# Patient Record
Sex: Male | Born: 1955 | Race: White | Hispanic: No | Marital: Married | Smoking: Never smoker
Health system: Southern US, Community
[De-identification: ages and names within clinical notes are randomized; demographics above are authoritative.]

## PROBLEM LIST (undated history)

## (undated) HISTORY — PX: HERNIA REPAIR: SHX51

## (undated) HISTORY — PX: APPENDECTOMY: SHX54

---

## 2005-07-15 ENCOUNTER — Ambulatory Visit: Payer: Self-pay | Admitting: Family Medicine

## 2005-09-30 ENCOUNTER — Ambulatory Visit: Payer: Self-pay | Admitting: Family Medicine

## 2018-06-07 ENCOUNTER — Other Ambulatory Visit: Payer: Self-pay

## 2018-06-07 ENCOUNTER — Emergency Department (HOSPITAL_COMMUNITY)
Admission: EM | Admit: 2018-06-07 | Discharge: 2018-06-08 | Disposition: A | Discharge status: Home / Self Care | Payer: BLUE CROSS/BLUE SHIELD | Attending: Emergency Medicine | Admitting: Emergency Medicine

## 2018-06-07 ENCOUNTER — Encounter (HOSPITAL_COMMUNITY): Payer: Self-pay | Admitting: Emergency Medicine

## 2018-06-07 ENCOUNTER — Emergency Department (HOSPITAL_COMMUNITY): Payer: BLUE CROSS/BLUE SHIELD

## 2018-06-07 DIAGNOSIS — R202 Paresthesia of skin: Secondary | ICD-10-CM | POA: Insufficient documentation

## 2018-06-07 DIAGNOSIS — Z79899 Other long term (current) drug therapy: Secondary | ICD-10-CM | POA: Diagnosis not present

## 2018-06-07 DIAGNOSIS — Y999 Unspecified external cause status: Secondary | ICD-10-CM | POA: Diagnosis not present

## 2018-06-07 DIAGNOSIS — Y929 Unspecified place or not applicable: Secondary | ICD-10-CM | POA: Diagnosis not present

## 2018-06-07 DIAGNOSIS — Y9352 Activity, horseback riding: Secondary | ICD-10-CM | POA: Diagnosis not present

## 2018-06-07 DIAGNOSIS — R200 Anesthesia of skin: Secondary | ICD-10-CM | POA: Diagnosis not present

## 2018-06-07 DIAGNOSIS — S4991XA Unspecified injury of right shoulder and upper arm, initial encounter: Secondary | ICD-10-CM | POA: Diagnosis present

## 2018-06-07 DIAGNOSIS — S134XXA Sprain of ligaments of cervical spine, initial encounter: Secondary | ICD-10-CM | POA: Diagnosis not present

## 2018-06-07 LAB — COMPREHENSIVE METABOLIC PANEL
ALK PHOS: 58 U/L (ref 38–126)
ALT: 37 U/L (ref 0–44)
AST: 41 U/L (ref 15–41)
Albumin: 3.9 g/dL (ref 3.5–5.0)
Anion gap: 8 (ref 5–15)
BILIRUBIN TOTAL: 0.9 mg/dL (ref 0.3–1.2)
BUN: 19 mg/dL (ref 6–20)
CALCIUM: 9.4 mg/dL (ref 8.9–10.3)
CHLORIDE: 107 mmol/L (ref 98–111)
CO2: 24 mmol/L (ref 22–32)
CREATININE: 1.13 mg/dL (ref 0.61–1.24)
Glucose, Bld: 103 mg/dL — ABNORMAL HIGH (ref 70–99)
Potassium: 3.7 mmol/L (ref 3.5–5.1)
Sodium: 139 mmol/L (ref 135–145)
Total Protein: 6.3 g/dL — ABNORMAL LOW (ref 6.5–8.1)

## 2018-06-07 LAB — I-STAT CHEM 8, ED
BUN: 32 mg/dL — ABNORMAL HIGH (ref 6–20)
CREATININE: 1.2 mg/dL (ref 0.61–1.24)
Calcium, Ion: 1.05 mmol/L — ABNORMAL LOW (ref 1.15–1.40)
Chloride: 107 mmol/L (ref 98–111)
GLUCOSE: 102 mg/dL — AB (ref 70–99)
HCT: 43 % (ref 39.0–52.0)
HEMOGLOBIN: 14.6 g/dL (ref 13.0–17.0)
POTASSIUM: 5.6 mmol/L — AB (ref 3.5–5.1)
Sodium: 138 mmol/L (ref 135–145)
TCO2: 27 mmol/L (ref 22–32)

## 2018-06-07 LAB — CBC
HEMATOCRIT: 43.5 % (ref 39.0–52.0)
HEMOGLOBIN: 15 g/dL (ref 13.0–17.0)
MCH: 29.8 pg (ref 26.0–34.0)
MCHC: 34.5 g/dL (ref 30.0–36.0)
MCV: 86.5 fL (ref 80.0–100.0)
NRBC: 0 % (ref 0.0–0.2)
Platelets: 221 10*3/uL (ref 150–400)
RBC: 5.03 MIL/uL (ref 4.22–5.81)
RDW: 12 % (ref 11.5–15.5)
WBC: 10.9 10*3/uL — AB (ref 4.0–10.5)

## 2018-06-07 LAB — I-STAT CG4 LACTIC ACID, ED: Lactic Acid, Venous: 0.97 mmol/L (ref 0.5–1.9)

## 2018-06-07 LAB — SAMPLE TO BLOOD BANK

## 2018-06-07 LAB — PROTIME-INR
INR: 1.09
Prothrombin Time: 14 seconds (ref 11.4–15.2)

## 2018-06-07 LAB — CDS SEROLOGY

## 2018-06-07 LAB — ETHANOL: Alcohol, Ethyl (B): <10 mg/dL (ref <10)

## 2018-06-07 MED ORDER — DEXAMETHASONE SODIUM PHOSPHATE 10 MG/ML IJ SOLN
10.0000 mg | Freq: Once | INTRAMUSCULAR | Status: AC
  Administered 2018-06-08: 10 mg via INTRAVENOUS
  Filled 2018-06-07: qty 1

## 2018-06-07 MED ORDER — FENTANYL CITRATE (PF) 100 MCG/2ML IJ SOLN
50.0000 ug | INTRAMUSCULAR | Status: DC | PRN
  Administered 2018-06-07 (×2): 50 ug via INTRAVENOUS
  Filled 2018-06-07 (×2): qty 2

## 2018-06-07 MED ORDER — KETOROLAC TROMETHAMINE 30 MG/ML IJ SOLN
30.0000 mg | Freq: Once | INTRAMUSCULAR | Status: AC
  Administered 2018-06-08: 30 mg via INTRAVENOUS
  Filled 2018-06-07: qty 1

## 2018-06-07 MED ORDER — HYDROMORPHONE HCL 1 MG/ML IJ SOLN
1.0000 mg | Freq: Once | INTRAMUSCULAR | Status: AC
  Administered 2018-06-07: 1 mg via INTRAVENOUS
  Filled 2018-06-07: qty 1

## 2018-06-07 NOTE — ED Provider Notes (Signed)
[facility] EMERGENCY DEPARTMENT Provider Note   CSN: [id number] Arrival date & time: 06/07/18  1949     History   Chief Complaint Chief Complaint  Patient presents with  . Trauma  . Shoulder Pain    HPI Bryan Hebert is a 62 y.o. male.  HPI  Patient is a 62 year old male with no PMHx who presents s/p fall from horse this evening.  Patient states he was "bucked off and landed on his right arm."  No LOC however he reports seeing "stars."  No helmet.  He is not amnestic to the event.  Patient presently complains of RUE burning sensation and pain that is now progressing to his LUE.  No alleviating or aggravating factors.  Pain worsens when lifting his arms up.  Symptoms are constant and worsening.  He denies HA, vision changes, CP, SOB, abdominal pain, and N/V/D.  On arrival patient AO x3 with GCS 15.  HDS.  C-collar in place.  History reviewed. No pertinent past medical history.  There are no active problems to display for this patient.   History reviewed. No pertinent surgical history.      Home Medications    Prior to Admission medications   Medication Sig Start Date End Date Taking? Authorizing Provider  Multiple Vitamins-Minerals (MULTIVITAMIN WITH MINERALS) tablet Take 1 tablet by mouth daily.   Yes [provider]  Omega-3 Fatty Acids (FISH OIL) 1000 MG CAPS Take 1,000 mg by mouth daily.   Yes [provider]    Family History No family history on file.  Social History Social History   Tobacco Use  . Smoking status: Never Smoker  . Smokeless tobacco: Never Used  Substance Use Topics  . Alcohol use: Not Currently  . Drug use: Never     Allergies   Patient has no known allergies.   Review of Systems Review of Systems  Constitutional: Negative for chills and fever.  HENT: Negative for sore throat.   Eyes: Negative for pain and visual disturbance.  Respiratory: Negative for cough and shortness of breath.     Cardiovascular: Negative for chest pain and palpitations.  Gastrointestinal: Negative for abdominal pain, diarrhea, nausea and vomiting.  Genitourinary: Negative for dysuria and hematuria.  Musculoskeletal: Negative for back pain.       + Upper extremity pain  Skin: Negative for color change and rash.  Neurological: Negative for seizures and syncope.       + Burning pain in upper extremities  All other systems reviewed and are negative.    Physical Exam Updated Vital Signs BP (!) 146/87 (BP Location: Left Arm)   Pulse 85   Temp 98.2 F (36.8 C) (Oral)   Resp 18   SpO2 95%   Physical Exam  Constitutional: He is oriented to person, place, and time. He appears well-developed and well-nourished. No distress.  HENT:  Head: Normocephalic and atraumatic.  Mouth/Throat: Oropharynx is clear and moist.  Dirt on R face.  No hemotympanum bilaterally.  No septal hematoma.  Oropharynx atraumatic.  Eyes: Pupils are equal, round, and reactive to light. Conjunctivae and EOM are normal.  Neck: Neck supple.  C-collar in place.  Cardiovascular: Normal rate, regular rhythm and intact distal pulses.  Pulmonary/Chest: Effort normal and breath sounds normal. No respiratory distress. He has no wheezes.  Abdominal: Soft. He exhibits no distension. There is no tenderness. There is no guarding.  Musculoskeletal:  BUE with subjective burning paresthesias.  Equal grip strength bilaterally.  Difficulty  flexing shoulders bilaterally 2/2 pain.  Arm flexion/extension 4/5.  Neurovascularly intact with 2+ equal radial pulses. BLE atraumatic.  2+ DP pulses.  Full ROM and strength.  Intact sensation.  Compartments soft.    Neurological: He is alert and oriented to person, place, and time. No cranial nerve deficit.  No midline spinal TTP, stepoffs, or deformities.  Skin: Skin is warm and dry. Capillary refill takes less than 2 seconds. No rash noted.  No abrasions or wounds.  Psychiatric: He has a normal mood and  affect.  Nursing note and vitals reviewed.    ED Treatments / Results  Labs (all labs ordered are listed, but only abnormal results are displayed) Labs Reviewed  COMPREHENSIVE METABOLIC PANEL - Abnormal; Notable for the following components:      Result Value   Glucose, Bld 103 (*)    Total Protein 6.3 (*)    All other components within normal limits  CBC - Abnormal; Notable for the following components:   WBC 10.9 (*)    All other components within normal limits  I-STAT CHEM 8, ED - Abnormal; Notable for the following components:   Potassium 5.6 (*)    BUN 32 (*)    Glucose, Bld 102 (*)    Calcium, Ion 1.05 (*)    All other components within normal limits  CDS SEROLOGY  ETHANOL  PROTIME-INR  URINALYSIS, ROUTINE W REFLEX MICROSCOPIC  I-STAT CG4 LACTIC ACID, ED  SAMPLE TO BLOOD BANK    EKG None  Radiology Ct Head Wo Contrast  Result Date: 06/07/2018 CLINICAL DATA:  Patient was thrown from horse landing on right arm. EXAM: CT HEAD WITHOUT CONTRAST CT CERVICAL SPINE WITHOUT CONTRAST TECHNIQUE: Multidetector CT imaging of the head and cervical spine was performed following the standard protocol without intravenous contrast. Multiplanar CT image reconstructions of the cervical spine were also generated. COMPARISON:  None. FINDINGS: CT HEAD FINDINGS BRAIN: The ventricles and sulci are normal. No intraparenchymal hemorrhage, mass effect nor midline shift. No acute large vascular territory infarcts. Grey-white matter distinction is maintained. The basal ganglia are unremarkable. No abnormal extra-axial fluid collections. Basal cisterns are not effaced and midline. The brainstem and cerebellar hemispheres are without acute abnormalities. VASCULAR: Unremarkable. SKULL/SOFT TISSUES: No skull fracture. No significant soft tissue swelling. ORBITS/SINUSES: The included ocular globes and orbital contents are normal.The mastoid air cells are clear. Mild frontal and right ethmoid sinus mucosal  thickening. Minimal peripheral mucosal thickening of the included maxillary sinuses. No air-fluid levels. OTHER: None. CT CERVICAL SPINE FINDINGS Alignment: Maintained cervical lordosis Skull base and vertebrae: No acute fracture. No primary bone lesion or focal pathologic process. Soft tissues and spinal canal: No prevertebral fluid or swelling. No visible canal hematoma. Disc levels: C3-4 and C4-5 central disc bulges. No significant central or foraminal encroachment. Uncovertebral joint osteoarthritis with spurring noted at from C3-4 through C6-7 bilaterally contributing to foraminal encroachment, mild on the left at C3-4, moderate at C4-5 and mild at C6-7. Mild right-sided C3-4, C4-5 and mild-to-moderate C5-6 foraminal encroachment. Upper chest: Negative. Other: None IMPRESSION: CT head: No acute intracranial abnormality. CT cervical spine: No acute cervical spine fracture or posttraumatic listhesis. Uncovertebral joint osteoarthritis and disc bulges as above described. Electronically Signed   By: Tollie Eth M.D.   On: 06/07/2018 21:07   Ct Cervical Spine Wo Contrast  Result Date: 06/07/2018 CLINICAL DATA:  Patient was thrown from horse landing on right arm. EXAM: CT HEAD WITHOUT CONTRAST CT CERVICAL SPINE WITHOUT CONTRAST TECHNIQUE: Multidetector CT  imaging of the head and cervical spine was performed following the standard protocol without intravenous contrast. Multiplanar CT image reconstructions of the cervical spine were also generated. COMPARISON:  None. FINDINGS: CT HEAD FINDINGS BRAIN: The ventricles and sulci are normal. No intraparenchymal hemorrhage, mass effect nor midline shift. No acute large vascular territory infarcts. Grey-white matter distinction is maintained. The basal ganglia are unremarkable. No abnormal extra-axial fluid collections. Basal cisterns are not effaced and midline. The brainstem and cerebellar hemispheres are without acute abnormalities. VASCULAR: Unremarkable. SKULL/SOFT  TISSUES: No skull fracture. No significant soft tissue swelling. ORBITS/SINUSES: The included ocular globes and orbital contents are normal.The mastoid air cells are clear. Mild frontal and right ethmoid sinus mucosal thickening. Minimal peripheral mucosal thickening of the included maxillary sinuses. No air-fluid levels. OTHER: None. CT CERVICAL SPINE FINDINGS Alignment: Maintained cervical lordosis Skull base and vertebrae: No acute fracture. No primary bone lesion or focal pathologic process. Soft tissues and spinal canal: No prevertebral fluid or swelling. No visible canal hematoma. Disc levels: C3-4 and C4-5 central disc bulges. No significant central or foraminal encroachment. Uncovertebral joint osteoarthritis with spurring noted at from C3-4 through C6-7 bilaterally contributing to foraminal encroachment, mild on the left at C3-4, moderate at C4-5 and mild at C6-7. Mild right-sided C3-4, C4-5 and mild-to-moderate C5-6 foraminal encroachment. Upper chest: Negative. Other: None IMPRESSION: CT head: No acute intracranial abnormality. CT cervical spine: No acute cervical spine fracture or posttraumatic listhesis. Uncovertebral joint osteoarthritis and disc bulges as above described. Electronically Signed   By: Tollie Eth M.D.   On: 06/07/2018 21:07   Mr Cervical Spine W Or Wo Contrast  Result Date: 06/08/2018 CLINICAL DATA:  Thrown from a horse landing on the right arm. Right arm numbness in shoulder pain EXAM: MRI CERVICAL SPINE WITHOUT AND WITH CONTRAST TECHNIQUE: Multiplanar and multiecho pulse sequences of the cervical spine, to include the craniocervical junction and cervicothoracic junction, were obtained without and with intravenous contrast. CONTRAST:  7 cc Gadavist COMPARISON:  06/07/2018 CT scan FINDINGS: Alignment: No vertebral subluxation is observed. Vertebrae: Type 1 degenerative endplate findings eccentric to the left at C5-6 posteriorly. Associated subtle endplate enhancement. No  appreciable fracture. Cord: Abnormal central T2 signal without overt enhancement centrally along the gray matter of the cord at the C3-4 level, relatively bilaterally symmetric. The cord appears otherwise unremarkable. Posterior Fossa, vertebral arteries, paraspinal tissues: Prevertebral edema/fluid signal extending from the C1-2 level down to the C5 level. Supraspinous ligament edema tracking between C2, C3, and C4, with potential interspinous edema also at C2-3, C3-4, and questionably C4-5. Disc levels: C2-3: Unremarkable. C3-4: Prominent bilateral foraminal impingement and moderate central narrowing of the thecal sac due to central disc protrusion, uncinate spurring, and facet arthropathy. This is the level with central cord edema signal. Small right facet effusion. Possible left ligamentum flavum injury posteriorly at this level. C4-5: Prominent right and moderate to prominent left foraminal stenosis with borderline central narrowing of the thecal sac due to disc bulge, uncinate spurring, and facet arthropathy. C5-6: Prominent bilateral foraminal stenosis and moderate central narrowing of the thecal sac due to central disc protrusion, uncinate spurring, and facet arthropathy. C6-7: Prominent left and moderate to prominent right foraminal stenosis due to uncinate spurring, disc bulge, and facet arthropathy along with potential left foraminal disc protrusion. C7-T1: No impingement.  Mild bilateral facet arthropathy. IMPRESSION: 1. Suspected ligamentous injuries in the upper cervical spine most especially at C3-4 where there is prevertebral edema, a small right facet effusion, suspected ligamentum flavum  irregularity/tear, interspinous edema suggesting interspinous tear, and edema along the supraspinous ligament which may well be injured. This is a level of particular significance in this case because there is also some central cord edema/contusion. No overt subluxation. 2. There is also edema along the  supraspinous ligament at the C2-3 level, and possible interspinous edema at C2-3. The prevertebral edema extends all the way from C1 down to C5, and may be an indicator of an indistinct or occult anterior longitudinal ligament tear. 3. Cervical spondylosis and degenerative disc disease causing prominent levels of impingement at C3-4, C4-5, C5-6, and C6-7 primarily from spurring and degenerative disc disease. Electronically Signed   By: Gaylyn Rong M.D.   On: 06/08/2018 02:01   Dg Chest Port 1 View  Result Date: 06/07/2018 CLINICAL DATA:  Fall. EXAM: PORTABLE CHEST 1 VIEW COMPARISON:  06/07/2018 FINDINGS: Heart and mediastinal contours are within normal limits. No focal opacities or effusions. No acute bony abnormality. IMPRESSION: No active disease. Electronically Signed   By: Charlett Nose M.D.   On: 06/07/2018 20:34   Dg Shoulder Right Portable  Result Date: 06/07/2018 CLINICAL DATA:  Fall from horse, landing on right arm.  Pain. EXAM: PORTABLE RIGHT SHOULDER COMPARISON:  None. FINDINGS: Degenerative changes in the [facility] joint with joint space narrowing and spurring. Glenohumeral joint is maintained. No acute bony abnormality. Specifically, no fracture, subluxation, or dislocation. Soft tissues are intact. IMPRESSION: Mild degenerative changes in the right AC joint. No acute bony abnormality. Electronically Signed   By: Charlett Nose M.D.   On: 06/07/2018 20:38    Procedures Procedures (including critical care time)  Medications Ordered in ED Medications  fentaNYL (SUBLIMAZE) injection 50 mcg (50 mcg Intravenous Given 06/07/18 2218)  HYDROmorphone (DILAUDID) injection 1 mg (1 mg Intravenous Given 06/07/18 2353)  ketorolac (TORADOL) 30 MG/ML injection 30 mg (30 mg Intravenous Given 06/08/18 0001)  dexamethasone (DECADRON) injection 10 mg (10 mg Intravenous Given 06/08/18 0001)  gadobutrol (GADAVIST) 1 MMOL/ML injection 7.5 mL (7 mLs Intravenous Contrast Given 06/08/18 0110)     Initial  Impression / Assessment and Plan / ED Course  I have reviewed the triage vital signs and the nursing notes.  Pertinent labs & imaging results that were available during my care of the patient were reviewed by me and considered in my medical decision making (see chart for details).    Patient is a 62 year old male with no PMHx who presents s/p fall from horse this evening with burning sensation, pain, and weakness to BUE.  No LOC.  On arrival HDS and well appearing.  C collar in place.  Exam as above concerning for cervical spine injury vs cord trauma (central cord).  NVI throughout.  CTH - no acute intracranial abnormality CT C spine - no acute cervical spine fracture or posttraumatic listhesis.  Uncovertebral joint osteoarthritis and disc bulges. XR R shoulder - no acute bony abnormality, fracture, or dislocation CXR - no acute cardiopulmonary rpocess  Patient with continued BUE weakness, pain, and subjective burning after fentanyl x 3.  Will obtain MRI C spine to further assess.  Dilaudid, Toradol, and prednisone given.  Patient care transferred to [facility] on 06/09/2018 at 1201.  MRI is currently pending.  Please refer to their note for the remainder of ED care and ultimate disposition.   Final Clinical Impressions(s) / ED Diagnoses   Final diagnoses:  Fall from horse, initial encounter    ED Discharge Orders    None  Abelardo Diesel, MD 06/08/18 [id number]    Derwood Kaplan, MD 06/09/18 [phone]

## 2018-06-07 NOTE — ED Notes (Signed)
Pt st's no relief from pain med. 

## 2018-06-07 NOTE — ED Triage Notes (Signed)
Patient was thrown from horse and landed on right arm under him.  No LOC, full recall of incident.  Patient with right arm numbness and shoulder pain.  GCS 15.  VSS.

## 2018-06-08 ENCOUNTER — Emergency Department (HOSPITAL_COMMUNITY): Payer: BLUE CROSS/BLUE SHIELD

## 2018-06-08 MED ORDER — METHYLPREDNISOLONE 4 MG PO TBPK: ORAL_TABLET | ORAL | 0 refills | Status: DC

## 2018-06-08 MED ORDER — DIAZEPAM 2 MG PO TABS: 2.0000 mg | ORAL_TABLET | Freq: Three times a day (TID) | ORAL | 0 refills | Status: DC | PRN

## 2018-06-08 MED ORDER — GADOBUTROL 1 MMOL/ML IV SOLN
7.5000 mL | Freq: Once | INTRAVENOUS | Status: AC | PRN
  Administered 2018-06-08: 7 mL via INTRAVENOUS

## 2018-06-08 MED ORDER — OXYCODONE-ACETAMINOPHEN 5-325 MG PO TABS
2.0000 | ORAL_TABLET | Freq: Once | ORAL | Status: AC
  Administered 2018-06-08: 2 via ORAL
  Filled 2018-06-08: qty 2

## 2018-06-08 MED ORDER — OXYCODONE-ACETAMINOPHEN 5-325 MG PO TABS: 1.0000 | ORAL_TABLET | Freq: Four times a day (QID) | ORAL | 0 refills | Status: DC | PRN

## 2018-06-08 NOTE — ED Notes (Signed)
ED Provider at bedside. 

## 2018-06-08 NOTE — ED Provider Notes (Signed)
3:00 AM Patient care assumed from Dr. Dimple Casey at change of shift.  Pending MRI to further evaluate for potential C-spine injury after patient was thrown from horse.  MRI findings reviewed, concerning for suspected ligamentous injuries in the upper cervical spine most especially at C3-4.  There is some central cord edema/contusion noted at this level.  Edema also extends along the supraspinous ligament at the C2-3 level.  Overall, prevertebral edema extends from C1 down to C5.  These findings have been discussed with NP Doran Durand of the Neurosurgical service. Pending call back with recommendations.  The patient does state that he feels better following Decadron and Toradol.  He has intact sensation bilaterally.  Noted to have 5/5 strength shoulder shrugging against resistance as well as with abduction and abduction of the bilateral upper extremities against resistance.  Grip strength 5/5.  States that his burning discomfort has subsided.  3:18 AM MRI imaging reviewed by Dr. Lovell Sheehan of Neurosurgery. Plan discussed with NP Bergman. NP Doran Durand states that Dr. Lovell Sheehan is comfortable with outpatient follow up in the office on Tuesday given his clinical improvement with Decadron in the ED. Requests placement in Aspen collar. Patient to call the office later this morning to be given appointment time for 06/12/18.  4:01 AM The patient has been placed in an Aspen collar.  And has been discussed with patient and wife who verbalized understanding as well as comfort with discharge.  Had a long, detailed discussion about reasons to return to the emergency department.  Return precautions also provided. Patient discharged in stable condition with no unaddressed concerns.    Antony Madura, PA-C 06/08/18 0402    Zadie Rhine, MD 06/09/18 [phone]

## 2018-06-08 NOTE — ED Notes (Signed)
Patient verbalized understanding of dc instructions, vss, wife at bedside to escort patient home upon discharge. Nad.

## 2018-06-08 NOTE — ED Notes (Signed)
Pt returned from MRI °

## 2018-06-08 NOTE — Discharge Instructions (Addendum)
You were noted to have ligament injury and inflammation on your MRI today.  This requires follow-up with a neurosurgeon (specialist) to ensure proper healing.  Call later on today to schedule an appointment for this coming Tuesday, 06/12/2018.  Wear your cervical collar at all times.  Do not remove it unless instructed to do so by a neurosurgeon.  You should continue prednisone as prescribed.  Do not miss any doses.  You have also been given a prescription for Percocet for management of pain.  You may use this in addition to Valium for treatment of muscle spasms.  Do not drive or drink alcohol after taking Percocet or Valium as these medications may make you drowsy and impair your judgment.  If you develop new or worsening symptoms such as numbness in your arms or legs, weakness/inability to lift your arms or legs, incontinence of stool or urine, difficulty walking, difficulty gripping items with your hands, or other concerning symptoms, promptly return to the emergency department for repeat evaluation.

## 2019-06-07 ENCOUNTER — Other Ambulatory Visit: Payer: Self-pay | Admitting: Urology

## 2019-06-07 DIAGNOSIS — R310 Gross hematuria: Secondary | ICD-10-CM

## 2019-06-14 ENCOUNTER — Other Ambulatory Visit: Payer: Self-pay | Admitting: Urology

## 2019-06-14 DIAGNOSIS — R310 Gross hematuria: Secondary | ICD-10-CM

## 2019-06-20 ENCOUNTER — Ambulatory Visit
Admission: RE | Admit: 2019-06-20 | Discharge: 2019-06-20 | Disposition: A | Discharge status: Home / Self Care | Payer: BC Managed Care – PPO | Source: Ambulatory Visit | Attending: Urology | Admitting: Urology

## 2019-06-20 DIAGNOSIS — R310 Gross hematuria: Secondary | ICD-10-CM

## 2019-06-20 MED ORDER — IOPAMIDOL (ISOVUE-300) INJECTION 61%
125.0000 mL | Freq: Once | INTRAVENOUS | Status: AC | PRN
  Administered 2019-06-20: 125 mL via INTRAVENOUS

## 2019-07-05 ENCOUNTER — Institutional Professional Consult (permissible substitution): Payer: BC Managed Care – PPO | Admitting: Internal Medicine

## 2019-07-10 ENCOUNTER — Other Ambulatory Visit: Payer: Self-pay

## 2019-07-10 ENCOUNTER — Encounter: Payer: Self-pay | Admitting: Internal Medicine

## 2019-07-10 ENCOUNTER — Ambulatory Visit (INDEPENDENT_AMBULATORY_CARE_PROVIDER_SITE_OTHER): Payer: BC Managed Care – PPO | Admitting: Internal Medicine

## 2019-07-10 DIAGNOSIS — R911 Solitary pulmonary nodule: Secondary | ICD-10-CM | POA: Diagnosis not present

## 2019-07-10 NOTE — Patient Instructions (Signed)
The next step is to schedule a PET to see if there is metabolic activity in the nodule that would indicate it might be a tumor and if so what kind

## 2019-07-10 NOTE — Assessment & Plan Note (Signed)
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   Discussed in detail all the  indications, usual  risks and alternatives  relative to the benefits with patient who agrees to proceed with w/u as outlined.      Total time devoted to counseling  > 50 % of initial 23min office visit:  reviewed case with pt/ discussion of options/alternatives/ personally creating written customized instructions  in presence of pt  then going over those specific  Instructions directly with the pt including how to use all of the meds but in particular covering each new medication in detail and the difference between the maintenance= "automatic" meds and the prns using an action plan format for the latter (If this problem/symptom => do that organization reading Left to right).  Please see AVS from this visit for a full list of these instructions which I personally wrote for this pt and  are unique to this visit.

## 2019-07-10 NOTE — Progress Notes (Signed)
   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      History of Present Illness  07/10/2019  Pulmonary/ 1st office eval/[facility]  Chief Complaint  Patient presents with  . Pulmonary Consult    Referred by Dr. Nila Nephew for eval of lung mass.    Dyspnea:  Works as Development worker, international [employer], Chiropractor, shovel all day, works out with wts no sob  Cough: none Sleep: no resp symptoms flat one pillow  SABA use: none  No obvious day to day or daytime variability or assoc excess/ purulent sputum or mucus plugs or hemoptysis or cp or chest tightness, subjective wheeze or overt sinus or hb symptoms.   sleeping without nocturnal  or early am exacerbation  of respiratory  c/o's or need for noct saba. Also denies any obvious fluctuation of symptoms with weather or environmental changes or other aggravating or alleviating factors except as outlined above   No unusual exposure hx or h/o childhood pna/ asthma or knowledge of premature birth.  Current Allergies, Complete Past Medical History, Past Surgical History, Family History, and Social History were reviewed in [employer] record.  ROS  The following are not active complaints unless bolded Hoarseness, sore throat, dysphagia, dental problems, itching, sneezing,  nasal congestion or discharge of excess mucus or purulent secretions, ear ache,   fever, chills, sweats, unintended wt loss or wt gain, classically pleuritic or exertional cp,  orthopnea pnd or arm/hand swelling  or leg swelling, presyncope, palpitations, abdominal pain, anorexia, nausea, vomiting, diarrhea  or change in bowel habits or change in bladder habits, change in stools or change in  urine= hematuria, resolved, dysuria, hematuria,  rash, arthralgias, visual complaints, headache, numbness, weakness or ataxia or problems with walking or coordination,  change in mood or  memory.          No past medical history on file.  Outpatient Medications Prior to Visit  Medication Sig Dispense Refill  . Multiple Vitamins-Minerals (MULTIVITAMIN WITH MINERALS) tablet Take 1 tablet by mouth daily.    . Omega-3 Fatty Acids (FISH OIL) 1000 MG CAPS Take 1,000 mg by mouth daily.    . tamsulosin (FLOMAX) 0.4 MG CAPS capsule Take 0.4 mg by mouth at bedtime.    . diazepam (VALIUM) 2 MG tablet Take 1 tablet (2 mg total) by mouth every 8 (eight) hours as needed for muscle spasms. 20 tablet 0  .     0  . oxyCODONE-acetaminophen (PERCOCET/ROXICET) 5-325 MG tablet Take 1-2 tablets by mouth every 6 (six) hours as needed for severe pain. 20 tablet 0         Objective:     BP 122/78 (BP Location: Left Arm, Cuff Size: Normal)   Pulse 60   Temp 97.6 F (36.4 C) (Temporal)   Ht 5' 8.5" (1.74 m)   Wt 156 lb 12.8 oz (71.1 kg)   SpO2 100% Comment: on RA  BMI 23.49 kg/m   SpO2: 100 %(on RA)      Assessment   No problem-specific Assessment & Plan notes found for this encounter.     Christinia Gully, MD 07/10/2019

## 2019-07-18 ENCOUNTER — Ambulatory Visit (HOSPITAL_COMMUNITY): Payer: BC Managed Care – PPO

## 2019-07-24 ENCOUNTER — Other Ambulatory Visit: Payer: Self-pay

## 2019-07-24 ENCOUNTER — Encounter (HOSPITAL_COMMUNITY)
Admission: RE | Admit: 2019-07-24 | Discharge: 2019-07-24 | Disposition: A | Discharge status: Home / Self Care | Payer: BC Managed Care – PPO | Source: Ambulatory Visit | Attending: Internal Medicine | Admitting: Internal Medicine

## 2019-07-24 DIAGNOSIS — R911 Solitary pulmonary nodule: Secondary | ICD-10-CM | POA: Diagnosis present

## 2019-07-24 LAB — GLUCOSE, CAPILLARY: Glucose-Capillary: 88 mg/dL (ref 70–99)

## 2019-07-24 MED ORDER — FLUDEOXYGLUCOSE F - 18 (FDG) INJECTION: 7.8000 | Freq: Once | INTRAVENOUS | Status: DC | PRN

## 2019-07-30 ENCOUNTER — Other Ambulatory Visit: Payer: Self-pay | Admitting: Internal Medicine

## 2019-07-30 DIAGNOSIS — R911 Solitary pulmonary nodule: Secondary | ICD-10-CM

## 2019-07-30 NOTE — Progress Notes (Signed)
Referral made 

## 2019-08-09 ENCOUNTER — Other Ambulatory Visit: Payer: Self-pay | Admitting: Thoracic Surgery (Cardiothoracic Vascular Surgery)

## 2019-08-09 ENCOUNTER — Institutional Professional Consult (permissible substitution) (INDEPENDENT_AMBULATORY_CARE_PROVIDER_SITE_OTHER): Payer: BC Managed Care – PPO | Admitting: Thoracic Surgery (Cardiothoracic Vascular Surgery)

## 2019-08-09 ENCOUNTER — Other Ambulatory Visit: Payer: Self-pay | Admitting: *Deleted

## 2019-08-09 ENCOUNTER — Other Ambulatory Visit: Payer: Self-pay

## 2019-08-09 ENCOUNTER — Encounter: Payer: Self-pay | Admitting: Thoracic Surgery (Cardiothoracic Vascular Surgery)

## 2019-08-09 ENCOUNTER — Other Ambulatory Visit (HOSPITAL_COMMUNITY): Payer: BC Managed Care – PPO

## 2019-08-09 VITALS — BP 151/90 | HR 77 | Temp 97.3°F | Resp 16 | Ht 68.0 in | Wt 158.0 lb

## 2019-08-09 DIAGNOSIS — R918 Other nonspecific abnormal finding of lung field: Secondary | ICD-10-CM

## 2019-08-09 DIAGNOSIS — R911 Solitary pulmonary nodule: Secondary | ICD-10-CM

## 2019-08-09 NOTE — Progress Notes (Signed)
[address]       [place] [id number]             [phone]                    Bryan FoilJoseph N Hebert [facility] Record #[id number] Date of Birth: Dec 17, 1955  Referring: Bryan CowdenWert, Michael B, MD Primary Care: Patient, No Pcp Per Primary Cardiologist: No primary care provider on file.  Chief Complaint:    Chief Complaint  Patient presents with  . Lung Lesion    Consultation    History of Present Illness:    Bryan FoilJoseph N Hebert 63 y.o. male referred by Dr. Sherene SiresWert for surgical evaluation for right lower lobe pulmonary nodule that was PET avid on most recent scan.  This nodule was found incidentally on a CT scan of the abdomen while he was being worked up for hematuria.  Since that evaluation he has had no more hematuria.  He was seen by pulmonology and it was recommended that he undergo evaluation by CT surgery.  He denies any pulmonary symptoms, his weight has been stable, and he denies any neurologic symptoms.  He has had exposure to [employer], and previously worked as a Pharmacist, hospitalstone cutter.  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    Smoking Hx: Lifelong non-smoker   Zubrod Score: At the time of surgery this patient's most appropriate activity status/level should be described as: [x]     0    Normal activity, no symptoms []     1    Restricted in physical strenuous activity but ambulatory, able to do out light work []     2    Ambulatory and capable of self care, unable to do work activities, up and about               >50 % of waking hours                              []     3    Only limited self care, in bed greater than 50% of waking hours []     4    Completely disabled, no self care, confined to bed or chair []     5    Moribund   No past medical history on file.  No past surgical history on file.  No family history on file.   Social History   Tobacco Use  Smoking Status Never Smoker  Smokeless Tobacco Never Used     Social History   Substance and Sexual Activity  Alcohol Use Not Currently     No Known Allergies  Current Outpatient Medications  Medication Sig Dispense Refill  . Multiple Vitamins-Minerals (MULTIVITAMIN WITH MINERALS) tablet Take 1 tablet by mouth daily.    . Omega-3 Fatty Acids (FISH OIL) 1000 MG CAPS Take 1,000 mg by mouth daily.     No current facility-administered medications for this visit.    Review of Systems  Constitutional: Negative for chills, fever, malaise/fatigue and weight loss.  HENT: Negative.   Eyes: Negative.   Respiratory: Negative.   Cardiovascular: Negative.   Gastrointestinal: Negative.   Genitourinary: Negative for hematuria.  Musculoskeletal: Positive for myalgias and neck pain.  Neurological: Negative.      PHYSICAL EXAMINATION: BP (!) 151/90 (BP Location: Right Arm)   Pulse 77   Temp (!) 97.3  F (36.3 C) (Skin)   Resp 16   Ht 5\' 8"  (1.727 m)   Wt 158 lb (71.7 kg)   SpO2 96%   BMI 24.02 kg/m  Physical Exam  Constitutional: He is oriented to person, place, and time. He appears well-developed and well-nourished. No distress.  HENT:  Head: Normocephalic and atraumatic.  Eyes: Conjunctivae are normal. No scleral icterus.  Neck: No tracheal deviation present.  Cardiovascular: Normal rate and regular rhythm.  No murmur heard. Respiratory: Effort normal and breath sounds normal. No respiratory distress.  GI: He exhibits no distension.  Musculoskeletal:        General: Normal range of motion.     Cervical back: Normal range of motion.  Neurological: He is alert and oriented to person, place, and time.  Skin: Skin is warm and dry. He is not diaphoretic.    Diagnostic Studies & Laboratory data:     Recent Radiology Findings:   NM PET Image Initial (PI) Skull Base To Thigh  Result Date: 07/24/2019 CLINICAL DATA:  Initial treatment strategy for pulmonary nodule. EXAM: NUCLEAR MEDICINE PET SKULL BASE TO THIGH TECHNIQUE: 7.8 mCi F-18  FDG was injected intravenously. Full-ring PET imaging was performed from the skull base to thigh after the radiotracer. CT data was obtained and used for attenuation correction and anatomic localization. Fasting blood glucose: 88 mg/dl COMPARISON:  CT abdomen/pelvis dated 06/20/2019 FINDINGS: Mediastinal blood pool activity: SUV max 2.3 Liver activity: SUV max NA NECK: No hypermetabolic cervical lymphadenopathy. Incidental CT findings: none CHEST: 2.2 x 3.2 cm lobulated posterior right lower lobe mass, max SUV 2.7. Given persistence, this raises concern for a low-grade pulmonary malignancy, possibly pulmonary carcinoid or low-grade bronchogenic neoplasm. 7 mm short axis subcarinal node, within normal limits, max SUV 2.7. Otherwise, no suspicious thoracic lymphadenopathy. Incidental CT findings: Mild atherosclerotic calcifications of the aortic arch. ABDOMEN/PELVIS: No hypermetabolic abdominopelvic lymphadenopathy. No abnormal hypermetabolism in the liver, spleen, pancreas, or adrenal glands. Incidental CT findings: Thick-walled bladder, suggesting chronic bladder obstruction. SKELETON: No focal hypermetabolic activity to suggest skeletal metastasis. Incidental CT findings: Mild degenerative changes of the visualized thoracolumbar spine. IMPRESSION: 3.2 cm lobulated posterior right lower lobe mass, raising concern for low-grade pulmonary malignancy, possibly pulmonary carcinoid or low-grade bronchogenic neoplasm. 7 mm short axis subcarinal node, likely within normal limits. No findings suspicious for metastatic disease. Electronically Signed   By: Julian Hy M.D.   On: 07/24/2019 14:44       I have independently reviewed the above radiology studies  and reviewed the findings with the patient.   Recent Lab Findings: Lab Results  Component Value Date   WBC 10.9 (H) 06/07/2018   HGB 14.6 06/07/2018   HCT 43.0 06/07/2018   PLT 221 06/07/2018   GLUCOSE 102 (H) 06/07/2018   ALT 37 06/07/2018   AST 41  06/07/2018   NA 138 06/07/2018   K 5.6 (H) 06/07/2018   CL 107 06/07/2018   CREATININE 1.20 06/07/2018   BUN 32 (H) 06/07/2018   CO2 24 06/07/2018   INR 1.09 06/07/2018     PFTs: Pending   Assessment / Plan:   63 year old male with a 3.2 cm right lower lobe pulmonary mass.  On PET/CT and had an SUV uptake 2.7.  There is also a 7 mm subcarinal lymph node with an SUV of 2.7.  He is a lifelong non-smoker but has had significant particle exposure to his job.  He will require pulmonary function testing, and has agreed to proceed with  a bronchoscopy right thoracoscopy, and wedge resection of the right lower lobe and possible right lower lobectomy.  Risks and benefits have been discussed.  Given the low PET avidity, it is possible that this may be a carcinoid tumor.     I  spent 40 minutes with  the patient face to face and greater then 50% of the time was spent in counseling and coordination of care.    Corliss Skains 08/09/2019 1:49 PM

## 2019-08-09 NOTE — Progress Notes (Unsigned)
pf

## 2019-08-12 ENCOUNTER — Other Ambulatory Visit (HOSPITAL_COMMUNITY)
Admission: RE | Admit: 2019-08-12 | Discharge: 2019-08-12 | Disposition: A | Discharge status: Home / Self Care | Payer: BC Managed Care – PPO | Source: Ambulatory Visit | Attending: Thoracic Surgery (Cardiothoracic Vascular Surgery) | Admitting: Thoracic Surgery (Cardiothoracic Vascular Surgery)

## 2019-08-12 ENCOUNTER — Other Ambulatory Visit: Payer: Self-pay

## 2019-08-12 ENCOUNTER — Encounter (HOSPITAL_COMMUNITY)
Admission: RE | Admit: 2019-08-12 | Discharge: 2019-08-12 | Disposition: A | Discharge status: Home / Self Care | Payer: BC Managed Care – PPO | Source: Ambulatory Visit | Attending: Thoracic Surgery (Cardiothoracic Vascular Surgery) | Admitting: Thoracic Surgery (Cardiothoracic Vascular Surgery)

## 2019-08-12 ENCOUNTER — Encounter (HOSPITAL_COMMUNITY): Payer: Self-pay

## 2019-08-12 ENCOUNTER — Ambulatory Visit (HOSPITAL_COMMUNITY)
Admission: RE | Admit: 2019-08-12 | Discharge: 2019-08-12 | Disposition: A | Discharge status: Home / Self Care | Payer: BC Managed Care – PPO | Source: Ambulatory Visit | Attending: Thoracic Surgery (Cardiothoracic Vascular Surgery) | Admitting: Thoracic Surgery (Cardiothoracic Vascular Surgery)

## 2019-08-12 DIAGNOSIS — Z01818 Encounter for other preprocedural examination: Secondary | ICD-10-CM | POA: Insufficient documentation

## 2019-08-12 DIAGNOSIS — R911 Solitary pulmonary nodule: Secondary | ICD-10-CM

## 2019-08-12 DIAGNOSIS — Z20828 Contact with and (suspected) exposure to other viral communicable diseases: Secondary | ICD-10-CM | POA: Diagnosis not present

## 2019-08-12 LAB — COMPREHENSIVE METABOLIC PANEL
ALT: 33 U/L (ref 0–44)
AST: 27 U/L (ref 15–41)
Albumin: 3.8 g/dL (ref 3.5–5.0)
Alkaline Phosphatase: 59 U/L (ref 38–126)
Anion gap: 9 (ref 5–15)
BUN: 15 mg/dL (ref 8–23)
CO2: 22 mmol/L (ref 22–32)
Calcium: 8.9 mg/dL (ref 8.9–10.3)
Chloride: 108 mmol/L (ref 98–111)
Creatinine, Ser: 0.87 mg/dL (ref 0.61–1.24)
GFR calc Af Amer: >60 mL/min (ref >60)
GFR calc non Af Amer: >60 mL/min (ref >60)
Glucose, Bld: 99 mg/dL (ref 70–99)
Potassium: 3.7 mmol/L (ref 3.5–5.1)
Sodium: 139 mmol/L (ref 135–145)
Total Bilirubin: 1 mg/dL (ref 0.3–1.2)
Total Protein: 6.2 g/dL — ABNORMAL LOW (ref 6.5–8.1)

## 2019-08-12 LAB — URINALYSIS, ROUTINE W REFLEX MICROSCOPIC
Bilirubin Urine: NEGATIVE
Glucose, UA: NEGATIVE mg/dL
Hgb urine dipstick: NEGATIVE
Ketones, ur: NEGATIVE mg/dL
Leukocytes,Ua: NEGATIVE
Nitrite: NEGATIVE
Protein, ur: NEGATIVE mg/dL
Specific Gravity, Urine: 1.019 (ref 1.005–1.030)
pH: 6 (ref 5.0–8.0)

## 2019-08-12 LAB — CBC
HCT: 42.8 % (ref 39.0–52.0)
Hemoglobin: 14.8 g/dL (ref 13.0–17.0)
MCH: 30.1 pg (ref 26.0–34.0)
MCHC: 34.6 g/dL (ref 30.0–36.0)
MCV: 87 fL (ref 80.0–100.0)
Platelets: 215 10*3/uL (ref 150–400)
RBC: 4.92 MIL/uL (ref 4.22–5.81)
RDW: 11.9 % (ref 11.5–15.5)
WBC: 4.1 10*3/uL (ref 4.0–10.5)
nRBC: 0 % (ref 0.0–0.2)

## 2019-08-12 LAB — PROTIME-INR
INR: 1 (ref 0.8–1.2)
Prothrombin Time: 13.5 seconds (ref 11.4–15.2)

## 2019-08-12 LAB — ABO/RH: ABO/RH(D): O POS

## 2019-08-12 LAB — SURGICAL PCR SCREEN
MRSA, PCR: NEGATIVE
Staphylococcus aureus: NEGATIVE

## 2019-08-12 LAB — BLOOD GAS, ARTERIAL
Acid-Base Excess: 1.3 mmol/L (ref 0.0–2.0)
Bicarbonate: 25.3 mmol/L (ref 20.0–28.0)
Drawn by: 421801
FIO2: 21
O2 Saturation: 98.1 %
Patient temperature: 37
pCO2 arterial: 39.9 mmHg (ref 32.0–48.0)
pH, Arterial: 7.418 (ref 7.350–7.450)
pO2, Arterial: 111 mmHg — ABNORMAL HIGH (ref 83.0–108.0)

## 2019-08-12 LAB — SARS CORONAVIRUS 2 (TAT 6-24 HRS): SARS Coronavirus 2: NEGATIVE

## 2019-08-12 LAB — TYPE AND SCREEN
ABO/RH(D): O POS
Antibody Screen: NEGATIVE

## 2019-08-12 LAB — APTT: aPTT: 27 seconds (ref 24–36)

## 2019-08-12 NOTE — Progress Notes (Signed)
PCP:  Pt. Uses Urgent Care when needed. Cardiologist:  Denies  EKG:  08/12/19 CXR:  08/12/19 ECHO:  Denies Stress Test:  Denies Cardiac Cath:  Denies  Covid test 08/12/19  Patient denies shortness of breath, fever, cough, and chest pain at PAT appointment.  Patient verbalized understanding of instructions provided today at the PAT appointment.  Patient asked to review instructions at home and day of surgery.

## 2019-08-12 NOTE — Progress Notes (Signed)
[id number] [address], [address] AT [address] [place] [address] Phone: [phone] Fax: [phone]      Your procedure is scheduled on August 15, 2019.  Report to [facility] Main Entrance "A" at 5:30 A.M., and check in at the Admitting office.  Call this number if you have problems the morning of surgery:  [phone]  Call [phone] if you have any questions prior to your surgery date Monday-Friday 8am-4pm    Remember:  Do not eat or drink after midnight the night before your surgery   Take these medicines the morning of surgery with A SIP OF WATER:  None  As of today, STOP taking any Aspirin (unless otherwise instructed by your surgeon), Aleve, Naproxen, Ibuprofen, Motrin, Advil, Goody's, BC's, all herbal medications, fish oil, and all vitamins.    The Morning of Surgery  Do not wear jewelry.  Do not wear lotions, powders, colognes, or deodorant    Men may shave face and neck.  Do not bring valuables to the hospital.  [facility] is not responsible for any belongings or valuables.  If you are a smoker, DO NOT Smoke 24 hours prior to surgery  If you wear a CPAP at night please bring your mask, tubing, and machine the morning of surgery   Remember that you must have someone to transport you home after your surgery, and remain with you for 24 hours if you are discharged the same day.   Please bring cases for contacts, glasses, hearing aids, dentures or bridgework because it cannot be worn into surgery.    Leave your suitcase in the car.  After surgery it may be brought to your room.  For patients admitted to the hospital, discharge time will be determined by your treatment team.  Patients discharged the day of surgery will not be allowed to drive home.    Special instructions:   [facility]- Preparing For Surgery  Before surgery, you can play an important role. Because skin is not  sterile, your skin needs to be as free of germs as possible. You can reduce the number of germs on your skin by washing with CHG (chlorahexidine gluconate) Soap before surgery.  CHG is an antiseptic cleaner which kills germs and bonds with the skin to continue killing germs even after washing.    Oral Hygiene is also important to reduce your risk of infection.  Remember - BRUSH YOUR TEETH THE MORNING OF SURGERY WITH YOUR REGULAR TOOTHPASTE  Please do not use if you have an allergy to CHG or antibacterial soaps. If your skin becomes reddened/irritated stop using the CHG.  Do not shave (including legs and underarms) for at least 48 hours prior to first CHG shower. It is OK to shave your face.  Please follow these instructions carefully.   1. Shower the NIGHT BEFORE SURGERY and the MORNING OF SURGERY with CHG Soap.   2. If you chose to wash your hair, wash your hair first as usual with your normal shampoo.  3. After you shampoo, rinse your hair and body thoroughly to remove the shampoo.  4. Use CHG as you would any other liquid soap. You can apply CHG directly to the skin and wash gently with a scrungie or a clean washcloth.   5. Apply the CHG Soap to your body ONLY FROM THE NECK DOWN.  Do not use on open wounds or open sores.  Avoid contact with your eyes, ears, mouth and genitals (private parts). Wash Face and genitals (private parts)  with your normal soap.   6. Wash thoroughly, paying special attention to the area where your surgery will be performed.  7. Thoroughly rinse your body with warm water from the neck down.  8. DO NOT shower/wash with your normal soap after using and rinsing off the CHG Soap.  9. Pat yourself dry with a CLEAN TOWEL.  10. Wear CLEAN PAJAMAS to bed the night before surgery, wear comfortable clothes the morning of surgery  11. Place CLEAN SHEETS on your bed the night of your first shower and DO NOT SLEEP WITH PETS.    Day of Surgery:  Please shower the  morning of surgery with the CHG soap Do not apply any deodorants/lotions. Please wear clean clothes to the hospital/surgery center.   Remember to brush your teeth WITH YOUR REGULAR TOOTHPASTE.   Please read over the following fact sheets that you were given.

## 2019-08-14 ENCOUNTER — Ambulatory Visit (HOSPITAL_COMMUNITY)
Admission: RE | Admit: 2019-08-14 | Discharge: 2019-08-14 | Disposition: A | Discharge status: Home / Self Care | Payer: BC Managed Care – PPO | Source: Ambulatory Visit | Attending: Thoracic Surgery (Cardiothoracic Vascular Surgery) | Admitting: Thoracic Surgery (Cardiothoracic Vascular Surgery)

## 2019-08-14 ENCOUNTER — Other Ambulatory Visit: Payer: Self-pay

## 2019-08-14 DIAGNOSIS — R911 Solitary pulmonary nodule: Secondary | ICD-10-CM | POA: Insufficient documentation

## 2019-08-14 LAB — PULMONARY FUNCTION TEST
DL/VA % pred: 123 %
DL/VA: 5.23 ml/min/mmHg/L
DLCO cor % pred: 115 %
DLCO cor: 29.89 ml/min/mmHg
DLCO unc % pred: 116 %
DLCO unc: 30.06 ml/min/mmHg
FEF 25-75 Post: 2.21 L/sec
FEF 25-75 Pre: 2.09 L/sec
FEF2575-%Change-Post: 5 %
FEF2575-%Pred-Post: 80 %
FEF2575-%Pred-Pre: 76 %
FEV1-%Change-Post: 1 %
FEV1-%Pred-Post: 84 %
FEV1-%Pred-Pre: 83 %
FEV1-Post: 2.82 L
FEV1-Pre: 2.78 L
FEV1FVC-%Change-Post: 6 %
FEV1FVC-%Pred-Pre: 97 %
FEV6-%Change-Post: -4 %
FEV6-%Pred-Post: 84 %
FEV6-%Pred-Pre: 88 %
FEV6-Post: 3.54 L
FEV6-Pre: 3.71 L
FEV6FVC-%Change-Post: 0 %
FEV6FVC-%Pred-Post: 103 %
FEV6FVC-%Pred-Pre: 103 %
FVC-%Change-Post: -4 %
FVC-%Pred-Post: 81 %
FVC-%Pred-Pre: 85 %
FVC-Post: 3.6 L
FVC-Pre: 3.77 L
Post FEV1/FVC ratio: 78 %
Post FEV6/FVC ratio: 98 %
Pre FEV1/FVC ratio: 74 %
Pre FEV6/FVC Ratio: 98 %
RV % pred: 51 %
RV: 1.12 L
TLC % pred: 73 %
TLC: 4.87 L

## 2019-08-14 MED ORDER — ALBUTEROL SULFATE (2.5 MG/3ML) 0.083% IN NEBU
2.5000 mg | INHALATION_SOLUTION | Freq: Once | RESPIRATORY_TRACT | Status: AC
  Administered 2019-08-14: 09:00:00 2.5 mg via RESPIRATORY_TRACT

## 2019-08-15 ENCOUNTER — Inpatient Hospital Stay (HOSPITAL_COMMUNITY): Payer: BC Managed Care – PPO

## 2019-08-15 ENCOUNTER — Encounter (HOSPITAL_COMMUNITY): Payer: Self-pay | Admitting: Thoracic Surgery (Cardiothoracic Vascular Surgery)

## 2019-08-15 ENCOUNTER — Encounter (HOSPITAL_COMMUNITY)
Admission: RE | Disposition: A | Discharge status: Home / Self Care | Payer: Self-pay | Source: Home / Self Care | Attending: Thoracic Surgery (Cardiothoracic Vascular Surgery)

## 2019-08-15 ENCOUNTER — Inpatient Hospital Stay (HOSPITAL_COMMUNITY)
Admission: RE | Admit: 2019-08-15 | Discharge: 2019-08-16 | DRG: 165 | Disposition: A | Discharge status: Home / Self Care | Payer: BC Managed Care – PPO | Attending: Thoracic Surgery (Cardiothoracic Vascular Surgery) | Admitting: Thoracic Surgery (Cardiothoracic Vascular Surgery)

## 2019-08-15 ENCOUNTER — Other Ambulatory Visit: Payer: Self-pay

## 2019-08-15 DIAGNOSIS — J939 Pneumothorax, unspecified: Secondary | ICD-10-CM

## 2019-08-15 DIAGNOSIS — Z23 Encounter for immunization: Secondary | ICD-10-CM

## 2019-08-15 DIAGNOSIS — R911 Solitary pulmonary nodule: Secondary | ICD-10-CM | POA: Diagnosis present

## 2019-08-15 DIAGNOSIS — Z20828 Contact with and (suspected) exposure to other viral communicable diseases: Secondary | ICD-10-CM | POA: Diagnosis present

## 2019-08-15 HISTORY — PX: VIDEO BRONCHOSCOPY: SHX5072

## 2019-08-15 HISTORY — PX: VIDEO ASSISTED THORACOSCOPY (VATS)/WEDGE RESECTION: SHX6174

## 2019-08-15 LAB — GLUCOSE, CAPILLARY
Glucose-Capillary: 109 mg/dL — ABNORMAL HIGH (ref 70–99)
Glucose-Capillary: 123 mg/dL — ABNORMAL HIGH (ref 70–99)

## 2019-08-15 SURGERY — BRONCHOSCOPY, VIDEO-ASSISTED
Anesthesia: General | Site: Chest | Laterality: Right

## 2019-08-15 MED ORDER — MORPHINE SULFATE (PF) 2 MG/ML IV SOLN: 2.0000 mg | INTRAVENOUS | Status: DC | PRN

## 2019-08-15 MED ORDER — BUPIVACAINE LIPOSOME 1.3 % IJ SUSP
20.0000 mL | INTRAMUSCULAR | Status: DC
  Filled 2019-08-15: qty 20

## 2019-08-15 MED ORDER — OXYCODONE HCL 5 MG/5ML PO SOLN: 5.0000 mg | Freq: Once | ORAL | Status: DC | PRN

## 2019-08-15 MED ORDER — SODIUM CHLORIDE 0.9 % IV SOLN: INTRAVENOUS | Status: DC | PRN

## 2019-08-15 MED ORDER — SUGAMMADEX SODIUM 200 MG/2ML IV SOLN
INTRAVENOUS | Status: DC | PRN
  Administered 2019-08-15: 200 mg via INTRAVENOUS

## 2019-08-15 MED ORDER — CEFAZOLIN SODIUM-DEXTROSE 2-4 GM/100ML-% IV SOLN
2.0000 g | INTRAVENOUS | Status: AC
  Administered 2019-08-15: 2 g via INTRAVENOUS
  Filled 2019-08-15: qty 100

## 2019-08-15 MED ORDER — DEXAMETHASONE SODIUM PHOSPHATE 10 MG/ML IJ SOLN
INTRAMUSCULAR | Status: DC | PRN
  Administered 2019-08-15: 5 mg via INTRAVENOUS

## 2019-08-15 MED ORDER — PROMETHAZINE HCL 25 MG/ML IJ SOLN: 6.2500 mg | INTRAMUSCULAR | Status: DC | PRN

## 2019-08-15 MED ORDER — INSULIN ASPART 100 UNIT/ML ~~LOC~~ SOLN: 0.0000 [IU] | Freq: Four times a day (QID) | SUBCUTANEOUS | Status: DC

## 2019-08-15 MED ORDER — BUPIVACAINE HCL (PF) 0.5 % IJ SOLN
INTRAMUSCULAR | Status: AC
  Filled 2019-08-15: qty 30

## 2019-08-15 MED ORDER — FENTANYL CITRATE (PF) 250 MCG/5ML IJ SOLN
INTRAMUSCULAR | Status: DC | PRN
  Administered 2019-08-15 (×4): 50 ug via INTRAVENOUS

## 2019-08-15 MED ORDER — ACETAMINOPHEN 160 MG/5ML PO SOLN: 1000.0000 mg | Freq: Four times a day (QID) | ORAL | Status: DC

## 2019-08-15 MED ORDER — SODIUM CHLORIDE FLUSH 0.9 % IV SOLN
INTRAVENOUS | Status: DC | PRN
  Administered 2019-08-15: 10:00:00 100 mL

## 2019-08-15 MED ORDER — PHENYLEPHRINE 40 MCG/ML (10ML) SYRINGE FOR IV PUSH (FOR BLOOD PRESSURE SUPPORT)
PREFILLED_SYRINGE | INTRAVENOUS | Status: DC | PRN
  Administered 2019-08-15: 80 ug via INTRAVENOUS

## 2019-08-15 MED ORDER — LIDOCAINE 2% (20 MG/ML) 5 ML SYRINGE
INTRAMUSCULAR | Status: DC | PRN
  Administered 2019-08-15: 60 mg via INTRAVENOUS

## 2019-08-15 MED ORDER — ROCURONIUM BROMIDE 10 MG/ML (PF) SYRINGE
PREFILLED_SYRINGE | INTRAVENOUS | Status: DC | PRN
  Administered 2019-08-15: 10 mg via INTRAVENOUS
  Administered 2019-08-15: 20 mg via INTRAVENOUS
  Administered 2019-08-15: 60 mg via INTRAVENOUS

## 2019-08-15 MED ORDER — 0.9 % SODIUM CHLORIDE (POUR BTL) OPTIME
TOPICAL | Status: DC | PRN
  Administered 2019-08-15: 08:00:00 2000 mL

## 2019-08-15 MED ORDER — POTASSIUM CHLORIDE IN NACL 20-0.9 MEQ/L-% IV SOLN
INTRAVENOUS | Status: DC
  Filled 2019-08-15 (×3): qty 1000

## 2019-08-15 MED ORDER — PROPOFOL 10 MG/ML IV BOLUS
INTRAVENOUS | Status: AC
  Filled 2019-08-15: qty 20

## 2019-08-15 MED ORDER — KETOROLAC TROMETHAMINE 30 MG/ML IJ SOLN: 30.0000 mg | Freq: Once | INTRAMUSCULAR | Status: DC | PRN

## 2019-08-15 MED ORDER — SODIUM CHLORIDE 0.9 % IV SOLN
INTRAVENOUS | Status: DC | PRN
  Administered 2019-08-15: 50 ug/min via INTRAVENOUS

## 2019-08-15 MED ORDER — CHLORHEXIDINE GLUCONATE CLOTH 2 % EX PADS: 6.0000 | MEDICATED_PAD | Freq: Every day | CUTANEOUS | Status: DC

## 2019-08-15 MED ORDER — FENTANYL CITRATE (PF) 250 MCG/5ML IJ SOLN
INTRAMUSCULAR | Status: AC
  Filled 2019-08-15: qty 5

## 2019-08-15 MED ORDER — LACTATED RINGERS IV SOLN: INTRAVENOUS | Status: DC | PRN

## 2019-08-15 MED ORDER — TRAMADOL HCL 50 MG PO TABS
50.0000 mg | ORAL_TABLET | Freq: Four times a day (QID) | ORAL | Status: DC | PRN
  Administered 2019-08-16: 100 mg via ORAL
  Filled 2019-08-15: qty 2

## 2019-08-15 MED ORDER — HEMOSTATIC AGENTS (NO CHARGE) OPTIME
TOPICAL | Status: DC | PRN
  Administered 2019-08-15: 1 via TOPICAL

## 2019-08-15 MED ORDER — ACETAMINOPHEN 500 MG PO TABS
1000.0000 mg | ORAL_TABLET | Freq: Four times a day (QID) | ORAL | Status: DC
  Administered 2019-08-15 – 2019-08-16 (×5): 1000 mg via ORAL
  Filled 2019-08-15 (×5): qty 2

## 2019-08-15 MED ORDER — HYDROMORPHONE HCL 1 MG/ML IJ SOLN: 0.2500 mg | INTRAMUSCULAR | Status: DC | PRN

## 2019-08-15 MED ORDER — MIDAZOLAM HCL 2 MG/2ML IJ SOLN
INTRAMUSCULAR | Status: AC
  Filled 2019-08-15: qty 2

## 2019-08-15 MED ORDER — MIDAZOLAM HCL 2 MG/2ML IJ SOLN
INTRAMUSCULAR | Status: DC | PRN
  Administered 2019-08-15: 2 mg via INTRAVENOUS

## 2019-08-15 MED ORDER — ACETAMINOPHEN 500 MG PO TABS
1000.0000 mg | ORAL_TABLET | Freq: Once | ORAL | Status: AC
  Administered 2019-08-15: 1000 mg via ORAL
  Filled 2019-08-15: qty 2

## 2019-08-15 MED ORDER — LUNG SURGERY BOOK
Freq: Once | Status: AC
  Filled 2019-08-15 (×2): qty 1

## 2019-08-15 MED ORDER — ADULT MULTIVITAMIN W/MINERALS CH
1.0000 | ORAL_TABLET | Freq: Every day | ORAL | Status: DC
  Administered 2019-08-16: 1 via ORAL
  Filled 2019-08-15: qty 1

## 2019-08-15 MED ORDER — ONDANSETRON HCL 4 MG/2ML IJ SOLN: 4.0000 mg | Freq: Four times a day (QID) | INTRAMUSCULAR | Status: DC | PRN

## 2019-08-15 MED ORDER — SENNOSIDES-DOCUSATE SODIUM 8.6-50 MG PO TABS: 1.0000 | ORAL_TABLET | Freq: Every day | ORAL | Status: DC

## 2019-08-15 MED ORDER — ONDANSETRON HCL 4 MG/2ML IJ SOLN
INTRAMUSCULAR | Status: DC | PRN
  Administered 2019-08-15: 4 mg via INTRAVENOUS

## 2019-08-15 MED ORDER — CEFAZOLIN SODIUM-DEXTROSE 2-4 GM/100ML-% IV SOLN
2.0000 g | Freq: Three times a day (TID) | INTRAVENOUS | Status: AC
  Administered 2019-08-15 (×2): 2 g via INTRAVENOUS
  Filled 2019-08-15 (×2): qty 100

## 2019-08-15 MED ORDER — OXYCODONE HCL 5 MG PO TABS: 5.0000 mg | ORAL_TABLET | Freq: Once | ORAL | Status: DC | PRN

## 2019-08-15 MED ORDER — GLYCOPYRROLATE 0.2 MG/ML IJ SOLN
INTRAMUSCULAR | Status: DC | PRN
  Administered 2019-08-15: .1 mg via INTRAVENOUS

## 2019-08-15 MED ORDER — PROPOFOL 10 MG/ML IV BOLUS
INTRAVENOUS | Status: DC | PRN
  Administered 2019-08-15 (×2): 100 mg via INTRAVENOUS

## 2019-08-15 MED ORDER — KETOROLAC TROMETHAMINE 15 MG/ML IJ SOLN: 15.0000 mg | Freq: Four times a day (QID) | INTRAMUSCULAR | Status: AC

## 2019-08-15 MED ORDER — BISACODYL 5 MG PO TBEC
10.0000 mg | DELAYED_RELEASE_TABLET | Freq: Every day | ORAL | Status: DC
  Administered 2019-08-16: 10 mg via ORAL
  Filled 2019-08-15: qty 2

## 2019-08-15 SURGICAL SUPPLY — 101 items
APPLICATOR TIP EXT COSEAL (VASCULAR PRODUCTS) ×2 IMPLANT
APPLIER CLIP ROT 10 11.4 M/L (STAPLE)
BLADE CLIPPER SURG (BLADE) ×5 IMPLANT
CANISTER SUCT 3000ML PPV (MISCELLANEOUS) ×5 IMPLANT
CATH ROBINSON RED A/P 14FR (CATHETERS) IMPLANT
CATH THORACIC 28FR (CATHETERS) IMPLANT
CATH THORACIC 28FR RT ANG (CATHETERS) IMPLANT
CATH THORACIC 36FR (CATHETERS) IMPLANT
CATH THORACIC 36FR RT ANG (CATHETERS) IMPLANT
CATH TROCAR 20FR (CATHETERS) IMPLANT
CHLORAPREP W/TINT 26 (MISCELLANEOUS) ×5 IMPLANT
CLIP APPLIE ROT 10 11.4 M/L (STAPLE) IMPLANT
CLIP VESOCCLUDE MED 6/CT (CLIP) ×5 IMPLANT
CONN ST 1/4X3/8  BEN (MISCELLANEOUS)
CONN ST 1/4X3/8 BEN (MISCELLANEOUS) IMPLANT
CONN Y 3/8X3/8X3/8  BEN (MISCELLANEOUS)
CONN Y 3/8X3/8X3/8 BEN (MISCELLANEOUS) IMPLANT
CONT SPEC 4OZ CLIKSEAL STRL BL (MISCELLANEOUS) ×14 IMPLANT
COVER SURGICAL LIGHT HANDLE (MISCELLANEOUS) ×2 IMPLANT
COVER WAND RF STERILE (DRAPES) ×3 IMPLANT
DEFOGGER SCOPE WARMER CLEARIFY (MISCELLANEOUS) ×2 IMPLANT
DERMABOND ADVANCED (GAUZE/BANDAGES/DRESSINGS) ×2
DERMABOND ADVANCED .7 DNX12 (GAUZE/BANDAGES/DRESSINGS) IMPLANT
DISSECTOR BLUNT TIP ENDO 5MM (MISCELLANEOUS) IMPLANT
DRAIN CHANNEL 28F RND 3/8 FF (WOUND CARE) IMPLANT
DRAIN CHANNEL 32F RND 10.7 FF (WOUND CARE) IMPLANT
DRAPE WARM FLUID 44X44 (DRAPES) ×5 IMPLANT
ELECT BLADE 6.5 EXT (BLADE) ×5 IMPLANT
ELECT REM PT RETURN 9FT ADLT (ELECTROSURGICAL) ×5
ELECTRODE REM PT RTRN 9FT ADLT (ELECTROSURGICAL) ×3 IMPLANT
GAUZE SPONGE 4X4 12PLY STRL (GAUZE/BANDAGES/DRESSINGS) ×5 IMPLANT
GAUZE SPONGE 4X4 12PLY STRL LF (GAUZE/BANDAGES/DRESSINGS) ×2 IMPLANT
GLOVE BIO SURGEON STRL SZ 6.5 (GLOVE) ×4 IMPLANT
GLOVE BIO SURGEON STRL SZ7 (GLOVE) ×10 IMPLANT
GLOVE BIO SURGEONS STRL SZ 6.5 (GLOVE) ×4
GLOVE BIOGEL PI IND STRL 6.5 (GLOVE) IMPLANT
GLOVE BIOGEL PI INDICATOR 6.5 (GLOVE) ×6
GOWN STRL REUS W/ TWL LRG LVL3 (GOWN DISPOSABLE) ×6 IMPLANT
GOWN STRL REUS W/ TWL XL LVL3 (GOWN DISPOSABLE) ×3 IMPLANT
GOWN STRL REUS W/TWL LRG LVL3 (GOWN DISPOSABLE) ×4
GOWN STRL REUS W/TWL XL LVL3 (GOWN DISPOSABLE) ×2
HANDLE STAPLE ENDO GIA SHORT (STAPLE) ×1
HEMOSTAT SURGICEL 2X14 (HEMOSTASIS) IMPLANT
KIT BASIN OR (CUSTOM PROCEDURE TRAY) ×5 IMPLANT
KIT SUCTION CATH 14FR (SUCTIONS) IMPLANT
KIT TURNOVER KIT B (KITS) ×5 IMPLANT
NDL SPNL 18GX3.5 QUINCKE PK (NEEDLE) IMPLANT
NEEDLE 22X1 1/2 (OR ONLY) (NEEDLE) ×5 IMPLANT
NEEDLE SPNL 18GX3.5 QUINCKE PK (NEEDLE) IMPLANT
NS IRRIG 1000ML POUR BTL (IV SOLUTION) ×15 IMPLANT
PACK CHEST (CUSTOM PROCEDURE TRAY) ×5 IMPLANT
PACK UNIVERSAL I (CUSTOM PROCEDURE TRAY) ×5 IMPLANT
PAD ARMBOARD 7.5X6 YLW CONV (MISCELLANEOUS) ×10 IMPLANT
POUCH ENDO CATCH II 15MM (MISCELLANEOUS) IMPLANT
POUCH SPECIMEN RETRIEVAL 10MM (ENDOMECHANICALS) ×2 IMPLANT
RELOAD EGIA 45 MED/THCK PURPLE (STAPLE) ×14 IMPLANT
SCISSORS LAP 5X35 DISP (ENDOMECHANICALS) IMPLANT
SEALANT PROGEL (MISCELLANEOUS) IMPLANT
SEALANT SURG COSEAL 4ML (VASCULAR PRODUCTS) IMPLANT
SEALANT SURG COSEAL 8ML (VASCULAR PRODUCTS) ×2 IMPLANT
SEALER LIGASURE MARYLAND 30 (ELECTROSURGICAL) ×5 IMPLANT
SET IRRIG TUBING LAPAROSCOPIC (IRRIGATION / IRRIGATOR) ×2 IMPLANT
SPECIMEN JAR MEDIUM (MISCELLANEOUS) IMPLANT
SPONGE INTESTINAL PEANUT (DISPOSABLE) ×10 IMPLANT
SPONGE TONSIL TAPE 1 RFD (DISPOSABLE) ×5 IMPLANT
STAPLER ENDO GIA 12 SHRT THIN (STAPLE) ×3 IMPLANT
STAPLER ENDO GIA 12MM SHORT (STAPLE) ×4 IMPLANT
STOPCOCK 4 WAY LG BORE MALE ST (IV SETS) ×5 IMPLANT
SUT CHROMIC 0 SH (SUTURE) ×2 IMPLANT
SUT MNCRL AB 3-0 PS2 18 (SUTURE) IMPLANT
SUT MON AB 2-0 CT1 36 (SUTURE) IMPLANT
SUT PDS AB 1 CTX 36 (SUTURE) IMPLANT
SUT PROLENE 4 0 RB 1 (SUTURE)
SUT PROLENE 4-0 RB1 .5 CRCL 36 (SUTURE) IMPLANT
SUT SILK  1 MH (SUTURE)
SUT SILK 1 MH (SUTURE) IMPLANT
SUT SILK 1 TIES 10X30 (SUTURE) ×5 IMPLANT
SUT SILK 2 0 SH (SUTURE) IMPLANT
SUT SILK 2 0SH CR/8 30 (SUTURE) ×2 IMPLANT
SUT VIC AB 1 CTX 36 (SUTURE)
SUT VIC AB 1 CTX36XBRD ANBCTR (SUTURE) IMPLANT
SUT VIC AB 2-0 CT1 27 (SUTURE) ×4
SUT VIC AB 2-0 CT1 TAPERPNT 27 (SUTURE) ×6 IMPLANT
SUT VIC AB 3-0 SH 27 (SUTURE) ×2
SUT VIC AB 3-0 SH 27X BRD (SUTURE) ×3 IMPLANT
SUT VICRYL 0 UR6 27IN ABS (SUTURE) ×5 IMPLANT
SUT VICRYL 2 TP 1 (SUTURE) IMPLANT
SYR 10ML LL (SYRINGE) ×5 IMPLANT
SYR 30ML LL (SYRINGE) ×5 IMPLANT
SYR 50ML LL SCALE MARK (SYRINGE) ×5 IMPLANT
SYSTEM SAHARA CHEST DRAIN ATS (WOUND CARE) ×5 IMPLANT
TAPE CLOTH 4X10 WHT NS (GAUZE/BANDAGES/DRESSINGS) ×5 IMPLANT
TAPE CLOTH SURG 4X10 WHT LF (GAUZE/BANDAGES/DRESSINGS) ×2 IMPLANT
TIP APPLICATOR SPRAY EXTEND 16 (VASCULAR PRODUCTS) IMPLANT
TOWEL GREEN STERILE (TOWEL DISPOSABLE) ×5 IMPLANT
TOWEL GREEN STERILE FF (TOWEL DISPOSABLE) ×5 IMPLANT
TRAY FOLEY MTR SLVR 16FR STAT (SET/KITS/TRAYS/PACK) ×5 IMPLANT
TROCAR XCEL 12X100 BLDLESS (ENDOMECHANICALS) ×2 IMPLANT
TROCAR XCEL BLADELESS 5X75MML (TROCAR) ×5 IMPLANT
TUBING EXTENTION W/L.L. (IV SETS) ×5 IMPLANT
WATER STERILE IRR 1000ML POUR (IV SOLUTION) ×5 IMPLANT

## 2019-08-15 NOTE — Brief Op Note (Addendum)
08/15/2019  9:31 AM  PATIENT:  Celesta Aver  63 y.o. male  PRE-OPERATIVE DIAGNOSIS:  RIGHT LOWER LOBE MASS  POST-OPERATIVE DIAGNOSIS:  RIGHT LOWER LOBE MASS  PROCEDURE:  VIDEO BRONCHOSCOPY, RIGHT RIGHT VIDEO ASSISTED THORACOSCOPY (VATS), RIGHT LOWER LOBE WEDGE RESECTION, INTERCOSTAL NERVE BLOCK  SURGEON:  Surgeon(s) and Role:    Lightfoot, Lucile Crater, MD - Primary  PHYSICIAN ASSISTANT: Lars Pinks PA-C  ANESTHESIA:   general  EBL:  Per anesthesia record  BLOOD ADMINISTERED:none  DRAINS: Chest tube placed in the right pleural space   LOCAL MEDICATIONS USED:  OTHER Exparel  SPECIMEN:  Source of Specimen:  Wedge RLL  DISPOSITION OF SPECIMEN:  Pathology. Frozen section showed necrosis, purulenc, no cancer  COUNTS CORRECT:  YES  DICTATION: .Dragon Dictation  PLAN OF CARE: Admit to inpatient   PATIENT DISPOSITION:  PACU - hemodynamically stable.   Delay start of Pharmacological VTE agent (>24hrs) due to surgical blood loss or risk of bleeding: yes

## 2019-08-15 NOTE — Op Note (Signed)
      [address]       [place] [address]             [phone]        08/15/2019  Patient:  Celesta Aver Pre-Op Dx: Right lower lobe pulmonary nodule   Post-op Dx:  same Procedure: - Bronchoscopy - Right Video assisted thoracoscopy - Right lower lobe Wedge resection - Intercostal nerve block  Surgeon and Role:      * Luke Falero, Lucile Crater, MD - Primary    Josie Saunders, PA-C - assisting   Anesthesia  general EBL:  17ml Blood Administration: none Specimen:  Right lower lobe wedge  Drains: 28 F argyle chest tube in right chest Counts: correct   Indications: 63 yo male seen in clinic for surgical evaluation of a right lower lobe pulmonary nodule that was weakly avid on PET.  He elected to proceed with surgical biopsy Findings: Firm nodule in the right lower lobe.  Pathology revealed necrosis, and chronic infection, but no cancer  Operative Technique: After the risks, benefits and alternatives were thoroughly discussed, the patient was brought to the operative theatre.  Anesthesia was induced, and the bronchoscope was passed through the endotracheal tube.  All segmental bronchi were visualized.  The endotracheal tube was then exchanged for a double lumen tube.  The patient was then placed in a left lateral decubitus position and was prepped and draped in normal sterile fashion.  An appropriate surgical pause was performed, and pre-operative antibiotics were dosed accordingly.  We began with 3cm incision in the anterior axillary line at the 8th intercostal space.  The chest was entered, and we then placed a 1cm incision at the 10th intercostal space, and introduced our camera port.  The lung was directly visualized.  The right lower lobe pulmonary nodule was identified, and wedged out with sequential fires of an Endo-GIA stapler.  Pathology results were negative for lung cancer.  The chest was irrigated, and an air leak test was performed.  An intercostal  nerve block was performed under direct visualization.  A 5F chest with then placed, and we watch the remaining lobes re-expand.  The skin and soft tissue were closed with absorbable suture    The patient tolerated the procedure without any immediate complications, and was transferred to the PACU in stable condition.  Price Lachapelle Bary Leriche

## 2019-08-15 NOTE — Discharge Instructions (Signed)
Thoracoscopy, Care After °This sheet gives you information about how to care for yourself after your procedure. Your health care provider may also give you more specific instructions. If you have problems or questions, contact your health care provider. °What can I expect after the procedure? °After the procedure, it is common to have pain and soreness in the surgical area. °Follow these instructions at home: °Incision care ° °· Follow instructions from your health care provider about how to take care of your incision. Make sure you: °? Wash your hands with soap and water before you change your bandage (dressing). If soap and water are not available, use hand sanitizer. °? Change your dressing as told by your health care provider. °? Leave stitches (sutures), skin glue, or adhesive strips in place. These skin closures may need to stay in place for 2 weeks or longer. If adhesive strip edges start to loosen and curl up, you may trim the loose edges. Do not remove adhesive strips completely unless your health care provider tells you to do that. °· Check your incision areas every day for signs of infection. Check for: °? Redness, swelling, or pain. °? Fluid or blood. °? Warmth. °? Pus or a bad smell. °· Do not take baths, swim, or use a hot tub until your health care provider approves. You may take showers. °Medicines °· Take over-the-counter and prescription medicines only as told by your health care provider. °· If you were prescribed an antibiotic medicine, take it as told by your health care provider. Do not stop taking the antibiotic even if you start to feel better. °· Do not drive or use heavy machinery while taking prescription pain medicine. °· If you are taking prescription pain medicine, take actions to prevent or treat constipation. Your health care provider may recommend that you: °? Drink enough fluid to keep your urine pale yellow. °? Eat foods that are high in fiber, such as fresh fruits and vegetables,  whole grains, and beans. °? Limit foods that are high in fat and processed sugars, such as fried and sweet foods. °? Take an over-the-counter or prescription medicine for constipation. °Managing pain, stiffness, and swelling ° °· If directed, put ice on the affected area: °? Put ice in a plastic bag. °? Place a towel between your skin and the bag. °? Leave the ice on for 20 minutes, 2-3 times a day. °Preventing lung infection °· To prevent pneumonia and to keep your lungs healthy: °? Try to cough often. If it hurts to cough, hold a pillow against your chest as you cough. °? Take deep breaths or do breathing exercises as instructed by your health care provider. °? If you were given an incentive spirometer, use it as directed by your health care provider. °General instructions °· Do not lift anything that is heavier than 10 lb (4.5 kg), or the limit that you are told, until your health care provider says that it is safe. °· Do not use any products that contain nicotine or tobacco, such as cigarettes and e-cigarettes. These can delay healing after surgery. If you need help quitting, ask your health care provider. °· Avoid driving until your health care provider approves. °· If you have a chest drainage tube, care for it as instructed by your health care provider. Do not travel by airplane after the chest drainage tube is removed until your health care provider approves. °· Keep all follow-up visits as told by your health care provider. This is important. °Contact   a health care provider if: °· You have a fever. °· Pain medicines do not ease your pain. °· You have redness, swelling, or increasing pain in your incision area. °· You develop a cough that does not go away, or you are coughing up mucus that is yellow or green. °Get help right away if: °· You have fluid, blood, or pus coming from your incision. °· There is a bad smell coming from your incision or dressing. °· You develop a rash. °· You cough up blood. °· You  develop light-headedness, or you feel faint. °· You have difficulty breathing. °· You develop chest pain. °· Your heartbeat feels irregular or very fast. °These symptoms may represent a serious problem that is an emergency. Do not wait to see if the symptoms will go away. Get medical help right away. Call your local emergency services (911 in the [place] Do not drive yourself to the hospital. °Summary °· Follow instructions from your health care provider about how to take care of your incision. °· Do not drive or use heavy machinery while taking prescription pain medicine. °· Leave stitches (sutures), skin glue, or adhesive strips in place. °· Check your incision areas every day for signs of infection. °This information is not intended to replace advice given to you by your health care provider. Make sure you discuss any questions you have with your health care provider. °Document Released: 03/04/2005 Document Revised: 07/28/2017 Document Reviewed: 07/25/2017 °Elsevier Patient Education © 2020 [employer] ° °

## 2019-08-15 NOTE — Plan of Care (Signed)
Patient is progressing as expected. 

## 2019-08-15 NOTE — Anesthesia Procedure Notes (Signed)
Procedure Name: Intubation Date/Time: 08/15/2019 7:43 AM Performed by: Kathryne Hitch, CRNA Pre-anesthesia Checklist: Patient identified, Emergency Drugs available, Patient being monitored, Timeout performed and Suction available Patient Re-evaluated:Patient Re-evaluated prior to induction Oxygen Delivery Method: Circle system utilized Preoxygenation: Pre-oxygenation with 100% oxygen Induction Type: IV induction Ventilation: Mask ventilation without difficulty Laryngoscope Size: Mac and 3 Grade View: Grade I Tube type: Oral Endobronchial tube: Double lumen EBT and 39 Fr Number of attempts: 1 Airway Equipment and Method: Stylet and Fiberoptic brochoscope Placement Confirmation: ETT inserted through vocal cords under direct vision,  positive ETCO2 and breath sounds checked- equal and bilateral Secured at: 31 cm Tube secured with: Tape Dental Injury: Teeth and Oropharynx as per pre-operative assessment

## 2019-08-15 NOTE — Anesthesia Procedure Notes (Addendum)
Arterial Line Insertion Start/End12/17/2020 7:40 AM, 08/15/2019 7:50 AM Performed by: Murvin Natal, MD, anesthesiologist  Patient location: OR. Preanesthetic checklist: patient identified, risks and benefits discussed, surgical consent, monitors and equipment checked and timeout performed Right, radial was placed Catheter size: 20 G Hand hygiene performed , maximum sterile barriers used  and Seldinger technique used  Attempts: 3 Procedure performed using ultrasound guided technique. Ultrasound Notes:anatomy identified, needle tip was noted to be adjacent to the nerve/plexus identified, no ultrasound evidence of intravascular and/or intraneural injection and image(s) printed for medical record Following insertion, dressing applied and Biopatch. Post procedure complications: local hematoma. Additional procedure comments: Hematoma on left radial from previous arterial line attempts in AM .

## 2019-08-15 NOTE — Transfer of Care (Signed)
Immediate Anesthesia Transfer of Care Note  Patient: Bryan Hebert  Procedure(s) Performed: VIDEO BRONCHOSCOPY (N/A ) RIGHT VIDEO ASSISTED THORACOSCOPY (VATS) WITH RIGHT LOWER LOBE WEDGE RESECTION (Right Chest)  Patient Location: PACU  Anesthesia Type:General  Level of Consciousness: drowsy and patient cooperative  Airway & Oxygen Therapy: Patient Spontanous Breathing and Patient connected to face mask oxygen  Post-op Assessment: Report given to RN and Post -op Vital signs reviewed and stable  Post vital signs: Reviewed and stable  Last Vitals:  Vitals Value Taken Time  BP 143/100 08/15/19 1001  Temp    Pulse 70 08/15/19 1002  Resp 14 08/15/19 1002  SpO2 99 % 08/15/19 1002  Vitals shown include unvalidated device data.  Last Pain:  Vitals:   08/15/19 0622  TempSrc:   PainSc: 0-No pain         Complications: No apparent anesthesia complications

## 2019-08-15 NOTE — Anesthesia Preprocedure Evaluation (Addendum)
Anesthesia Evaluation  Patient identified by MRN, date of birth, ID band Patient awake    Reviewed: Allergy & Precautions, NPO status , Patient's Chart, lab work & pertinent test results  Airway Mallampati: II  TM Distance: >3 FB Neck ROM: Full    Dental  (+) Chipped,    Pulmonary    Pulmonary exam normal breath sounds clear to auscultation       Cardiovascular negative cardio ROS Normal cardiovascular exam Rhythm:Regular Rate:Normal  ECG: NSR, rate 76   Neuro/Psych negative neurological ROS  negative psych ROS   GI/Hepatic negative GI ROS, Neg liver ROS,   Endo/Other  negative endocrine ROS  Renal/GU negative Renal ROS     Musculoskeletal negative musculoskeletal ROS (+)   Abdominal   Peds  Hematology negative hematology ROS (+)   Anesthesia Other Findings PULMONARY NODULE  Reproductive/Obstetrics                            Anesthesia Physical Anesthesia Plan  ASA: II  Anesthesia Plan: General   Post-op Pain Management:    Induction: Intravenous  PONV Risk Score and Plan: 2 and Ondansetron, Dexamethasone, Midazolam and Treatment may vary due to age or medical condition  Airway Management Planned: Double Lumen EBT  Additional Equipment: Arterial line  Intra-op Plan:   Post-operative Plan: Extubation in OR  Informed Consent: I have reviewed the patients History and Physical, chart, labs and discussed the procedure including the risks, benefits and alternatives for the proposed anesthesia with the patient or authorized representative who has indicated his/her understanding and acceptance.     Dental advisory given  Plan Discussed with: CRNA  Anesthesia Plan Comments:        Anesthesia Quick Evaluation

## 2019-08-15 NOTE — Anesthesia Postprocedure Evaluation (Signed)
Anesthesia Post Note  Patient: Bryan Hebert  Procedure(s) Performed: VIDEO BRONCHOSCOPY (N/A ) RIGHT VIDEO ASSISTED THORACOSCOPY (VATS) WITH RIGHT LOWER LOBE WEDGE RESECTION (Right Chest)     Patient location during evaluation: PACU Anesthesia Type: General Level of consciousness: awake and alert Pain management: pain level controlled Vital Signs Assessment: post-procedure vital signs reviewed and stable Respiratory status: spontaneous breathing, nonlabored ventilation, respiratory function stable and patient connected to nasal cannula oxygen Cardiovascular status: blood pressure returned to baseline and stable Postop Assessment: no apparent nausea or vomiting Anesthetic complications: no    Last Vitals:  Vitals:   08/15/19 1951 08/15/19 2000  BP: (!) 141/79   Pulse: 100 85  Resp: (!) 22 19  Temp: 37.3 C   SpO2: 96% 95%    Last Pain:  Vitals:   08/15/19 1951  TempSrc: Oral  PainSc:                  Vinessa Macconnell P Olevia Westervelt

## 2019-08-15 NOTE — Discharge Summary (Signed)
Physician Discharge Summary       [address]       [place] [address]             [phone]    Patient ID: BAIRD POLINSKI MRN: [id number] DOB/AGE: [birth year] 63 y.o.  Admit date: 08/15/2019 Discharge date: 08/16/2019  Admission Diagnoses:  Right lower lobe pulmonary nodule  Discharge Diagnoses:   S/p right VATS, wedge RLL, intercostal nerve block    Consults: None  Procedure (s):  Bronchoscopy - Right Video assisted thoracoscopy - Right lower lobe Wedge resection - Intercostal nerve block by Dr. Kipp Brood on 08/14/2019.  Pathology: Final results pending  History of Presenting Illness: Bryan Hebert 63 y.o. male referred by Dr. Melvyn Novas for surgical evaluation for right lower lobe pulmonary nodule that was PET avid on most recent scan. This nodule was found incidentally on a CT scan of the abdomen while he was being worked up for hematuria. Since that evaluation he has had no more hematuria. He was seen by pulmonology and it was recommended that he undergo evaluation by CT surgery. He denies any pulmonary symptoms, his weight has been stable, and he denies any neurologic symptoms. 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  Dr. Kipp Brood discussed the need for bronchoscopy, right VATS, wedge RLL, intercostal nerve block, and possible lobectomy. Potential risks, benefits, and complications of the surgery were discussed with the patient and he agreed to proceed with surgery. He underwent a right VATS, wedge RLL, intercostal nerve block on 08/14/2019.  Brief Hospital Course:  The patient remained afebrile and hemodynamically stable. A line and foley were removed early in the post operative course. Chest tube showed no air leak. Daily chest x rays were obtained and remained stable. Chest tube was removed on 12/18. Patient is ambulating on room  air. Patient is tolerating a diet and has had a bowel movement. Wounds are clean and dry. Final chest X ray showed no pneumothorax, mild bibasilar subsegmental atelectasis R>L.. As discussed with Dr. Kipp Brood, the patient is felt surgically stable for discharge today.   Latest Vital Signs: Blood pressure 135/76, pulse 65, temperature 97.9 F (36.6 C), temperature source Oral, resp. rate 17, height 5\' 8"  (1.727 m), weight 71 kg, SpO2 95 %.  Physical Exam: Cardiovascular: RRR Pulmonary: Clear to auscultation bilaterally Abdomen: Soft, non tender, bowel sounds present. Extremities: SCDs in place Wounds: Clean and dry.  No erythema or signs of infection. Chest Tube: to suction, no air leak  Discharge Condition: Stable and discharged to home.  Recent laboratory studies:  Lab Results  Component Value Date   WBC 8.4 08/16/2019   HGB 13.0 08/16/2019   HCT 37.9 (L) 08/16/2019   MCV 88.8 08/16/2019   PLT 186 08/16/2019   Lab Results  Component Value Date   NA 139 08/16/2019   K 3.8 08/16/2019   CL 104 08/16/2019   CO2 26 08/16/2019   CREATININE 0.99 08/16/2019   GLUCOSE 97 08/16/2019      Diagnostic Studies: DG Chest 2 View  Result Date: 08/16/2019 CLINICAL DATA:  Pneumothorax.  Chest tube removal. EXAM: CHEST - 2 VIEW COMPARISON:  Same day. FINDINGS: The heart size and mediastinal contours are within normal limits. Right-sided chest tube has been removed. No pneumothorax or pleural effusion is noted. Mild bibasilar subsegmental atelectasis is noted, right greater than left. The  visualized skeletal structures are unremarkable. IMPRESSION: No pneumothorax status post right-sided chest tube removal. Mild bibasilar subsegmental atelectasis is noted, right greater than left. Electronically Signed   By: Lupita RaiderJames  Green Jr M.D.   On: 08/16/2019 12:30   DG Chest 2 View  Result Date: 08/12/2019 CLINICAL DATA:  Preoperative, right pulmonary nodule EXAM: CHEST - 2 VIEW COMPARISON:  PET-CT,  07/24/2019 FINDINGS: The heart size and mediastinal contours are within normal limits. No acute abnormality of the lungs. Lobulated pulmonary nodule of the right lower lobe is again seen, better appreciated by prior PET-CT. The visualized skeletal structures are unremarkable. IMPRESSION: 1.  No acute abnormality of the lungs. 2. Lobulated pulmonary nodule of the right lower lobe is again seen, better appreciated by prior PET-CT. Electronically Signed   By: Lauralyn PrimesAlex  Bibbey M.D.   On: 08/12/2019 12:55   NM PET Image Initial (PI) Skull Base To Thigh  Result Date: 07/24/2019 CLINICAL DATA:  Initial treatment strategy for pulmonary nodule. EXAM: NUCLEAR MEDICINE PET SKULL BASE TO THIGH TECHNIQUE: 7.8 mCi F-18 FDG was injected intravenously. Full-ring PET imaging was performed from the skull base to thigh after the radiotracer. CT data was obtained and used for attenuation correction and anatomic localization. Fasting blood glucose: 88 mg/dl COMPARISON:  CT abdomen/pelvis dated 06/20/2019 FINDINGS: Mediastinal blood pool activity: SUV max 2.3 Liver activity: SUV max NA NECK: No hypermetabolic cervical lymphadenopathy. Incidental CT findings: none CHEST: 2.2 x 3.2 cm lobulated posterior right lower lobe mass, max SUV 2.7. Given persistence, this raises concern for a low-grade pulmonary malignancy, possibly pulmonary carcinoid or low-grade bronchogenic neoplasm. 7 mm short axis subcarinal node, within normal limits, max SUV 2.7. Otherwise, no suspicious thoracic lymphadenopathy. Incidental CT findings: Mild atherosclerotic calcifications of the aortic arch. ABDOMEN/PELVIS: No hypermetabolic abdominopelvic lymphadenopathy. No abnormal hypermetabolism in the liver, spleen, pancreas, or adrenal glands. Incidental CT findings: Thick-walled bladder, suggesting chronic bladder obstruction. SKELETON: No focal hypermetabolic activity to suggest skeletal metastasis. Incidental CT findings: Mild degenerative changes of the  visualized thoracolumbar spine. IMPRESSION: 3.2 cm lobulated posterior right lower lobe mass, raising concern for low-grade pulmonary malignancy, possibly pulmonary carcinoid or low-grade bronchogenic neoplasm. 7 mm short axis subcarinal node, likely within normal limits. No findings suspicious for metastatic disease. Electronically Signed   By: Charline BillsSriyesh  Krishnan M.D.   On: 07/24/2019 14:44   DG CHEST PORT 1 VIEW  Result Date: 08/16/2019 CLINICAL DATA:  Chest tube status.  Pneumothorax. EXAM: PORTABLE CHEST 1 VIEW COMPARISON:  08/15/2019 FINDINGS: Right IJ central venous catheter unchanged. Right apical chest tube unchanged. Lungs are adequately inflated with stable opacification over the medial right base with associated surgical sutures over the medial right base. Previously seen tiny right apical pneumothorax no longer visualized. Cardiomediastinal silhouette and remainder of the exam is unchanged. IMPRESSION: 1. Stable to slight worsening opacification medial right base with associated surgical sutures in this area. Findings likely due to atelectasis. 2. Tubes and lines as described. No residual right apical pneumothorax. Electronically Signed   By: Elberta Fortisaniel  Boyle M.D.   On: 08/16/2019 08:02   DG Chest Port 1 View  Result Date: 08/15/2019 CLINICAL DATA:  VATS chest tube. EXAM: PORTABLE CHEST 1 VIEW COMPARISON:  08/12/2019. FINDINGS: Right IJ line noted with tip over SVC. Right chest tube noted with tip over right pulmonary apex. Miniscule right apical pneumothorax noted. Cardiomegaly. Postsurgical changes right lung base with right base atelectatic changes. Mild left base subsegmental atelectasis. Heart size stable. IMPRESSION: 1. Right IJ line noted with  tip over SVC. Right chest tube noted with tip over right pulmonary apex. Miniscule right apical pneumothorax noted. 2. Postsurgical changes right lung base with atelectatic changes. Mild left base subsegmental atelectasis. These results will be called  to the ordering clinician or representative by the Radiologist Assistant, and communication documented in the PACS or zVision Dashboard. Electronically Signed   By: Maisie Fus  Register   On: 08/15/2019 10:16    Discharge Medications: Allergies as of 08/16/2019   No Known Allergies     Medication List    TAKE these medications   Fish Oil 1000 MG Caps Take 1,000 mg by mouth daily.   multivitamin with minerals tablet Take 1 tablet by mouth daily.   traMADol 50 MG tablet Commonly known as: ULTRAM Take 1 tablet (50 mg total) by mouth every 6 (six) hours as needed for moderate pain.       Follow Up Appointments: Follow-up Information    Corliss Skains, MD. Go on 08/21/2019.   Specialty: Cardiothoracic Surgery Why: Appointment time is at 3:45 pm Contact information: [address] [place] [address] [phone]           Signed: Lelon Huh [facility] 08/19/2019, 7:47 AM

## 2019-08-15 NOTE — H&P (Signed)
[address]  [place] [address]  [phone]     Nickalas N Burruss  [facility] Medical Record #[id number]  Date of Birth: 13-Dec-1955  Referring: Tanda Rockers, MD  Primary Care: Patient, No Pcp Per  Primary Cardiologist: No primary care provider on file.   No significant changes since his clinic appointment. He does have some chronic back pain    Per my last note  History of Present Illness:  TRYSON LUMLEY 63 y.o. male referred by Dr. Melvyn Novas for surgical evaluation for right lower lobe pulmonary nodule that was PET avid on most recent scan. This nodule was found incidentally on a CT scan of the abdomen while he was being worked up for hematuria. Since that evaluation he has had no more hematuria. He was seen by pulmonology and it was recommended that he undergo evaluation by CT surgery. He denies any pulmonary symptoms, his weight has been stable, and he denies any neurologic symptoms. 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  Smoking Hx:  Lifelong non-smoker  Zubrod Score:  At the time of surgery this patient's most appropriate activity status/level should be described as:  ? 0 Normal activity, no symptoms  ? 1 Restricted in physical strenuous activity but ambulatory, able to do out light work  ? 2 Ambulatory and capable of self care, unable to do work activities, up and about >50 % of waking hours  ? 3 Only limited self care, in bed greater than 50% of waking hours  ? 4 Completely disabled, no self care, confined to bed or chair  ? 5 Moribund  No past medical history on file.  No past surgical history on file.  No family history on file.  Social History      Tobacco Use  Smoking Status Never Smoker  Smokeless Tobacco Never Used   Social History      Substance and Sexual Activity  Alcohol Use Not Currently   No Known Allergies        Current Outpatient Medications  Medication Sig Dispense Refill  . Multiple Vitamins-Minerals (MULTIVITAMIN WITH MINERALS) tablet Take 1 tablet by mouth daily.    . Omega-3 Fatty Acids (FISH OIL) 1000 MG CAPS Take 1,000 mg by mouth daily.     No current facility-administered medications for this visit.   Review of Systems  Constitutional: Negative for chills, fever, malaise/fatigue and weight loss.  HENT: Negative.  Eyes: Negative.  Respiratory: Negative.  Cardiovascular: Negative.  Gastrointestinal: Negative.  Genitourinary: Negative for hematuria.  Musculoskeletal: Positive for myalgias and neck pain.  Neurological: Negative.   PHYSICAL EXAMINATION:  Vitals:   08/15/19 0557  BP: (!) 171/97  Pulse: 89  Resp: 20  Temp: 98.5 F (36.9 C)  SpO2: 98%    Physical Exam  Constitutional: He is oriented to person, place, and time. He appears well-developed and well-nourished. No distress.  HENT:  Head: Normocephalic and atraumatic.  Eyes: Conjunctivae are normal. No scleral icterus.  Neck: No tracheal deviation present.  Cardiovascular: Normal rate and regular rhythm.  No murmur heard.  Respiratory: Effort normal and breath sounds normal. No respiratory distress.  GI: He exhibits no distension.  Musculoskeletal:  General: Normal range of motion.  Cervical back: Normal range of motion.  Neurological: He is alert and oriented to person, place, and time.  Skin: Skin is warm and dry. He  is not diaphoretic.   Diagnostic Studies & Laboratory data:  Recent Radiology Findings:  Imaging Results    I have independently reviewed the above radiology studies and reviewed the findings with the patient.  Recent Lab Findings:  Recent Labs                                                                                                    PFTs:  Pending  Assessment / Plan:  63 year old male with a 3.2 cm right lower lobe pulmonary mass. On PET/CT and had an SUV uptake  2.7. There is also a 7 mm subcarinal lymph node with an SUV of 2.7. He is a lifelong non-smoker but has had significant particle exposure to his job. Pulmonary function testing is adequate.  OR today for bronchoscopy right thoracoscopy, and wedge resection of the right lower lobe and possible right lower lobectomy. Risks and benefits have been discussed. Given the low PET avidity, it is possible that this may be a carcinoid tumor.   Tanvir Hipple Keane Scrape

## 2019-08-15 NOTE — Anesthesia Procedure Notes (Signed)
Central Venous Catheter Insertion Performed by: Lillia Abed, MD, anesthesiologist Start/End12/17/2020 7:05 AM, 08/15/2019 7:15 AM Patient location: Pre-op. Preanesthetic checklist: patient identified, IV checked, site marked, risks and benefits discussed, surgical consent, monitors and equipment checked, pre-op evaluation, timeout performed and anesthesia consent Lidocaine 1% used for infiltration and patient sedated Hand hygiene performed  and maximum sterile barriers used  Catheter size: 8 Fr Total catheter length 16. Central line was placed.Double lumen Procedure performed using ultrasound guided technique. Ultrasound Notes:anatomy identified, needle tip was noted to be adjacent to the nerve/plexus identified, no ultrasound evidence of intravascular and/or intraneural injection and image(s) printed for medical record Attempts: 1 Following insertion, dressing applied, line sutured and Biopatch. Post procedure assessment: blood return through all ports, free fluid flow and no air  Patient tolerated the procedure well with no immediate complications.

## 2019-08-15 NOTE — Anesthesia Procedure Notes (Addendum)
Arterial Line Insertion Start/End12/17/2020 7:50 AM, 08/15/2019 8:00 AM Performed by: Murvin Natal, MD, anesthesiologist  Patient location: OR. Preanesthetic checklist: patient identified, IV checked, site marked, risks and benefits discussed, surgical consent, monitors and equipment checked, pre-op evaluation, timeout performed and anesthesia consent Patient sedated Right, radial was placed Catheter size: 20 Fr Hand hygiene performed , maximum sterile barriers used  and Seldinger technique used  Attempts: 1 Procedure performed using ultrasound guided technique. Ultrasound Notes:anatomy identified, needle tip was noted to be adjacent to the nerve/plexus identified and no ultrasound evidence of intravascular and/or intraneural injection Following insertion, dressing applied and Biopatch. Post procedure assessment: normal and unchanged  Patient tolerated the procedure well with no immediate complications.

## 2019-08-16 ENCOUNTER — Other Ambulatory Visit: Payer: Self-pay | Admitting: Physician Assistant

## 2019-08-16 ENCOUNTER — Other Ambulatory Visit: Payer: Self-pay

## 2019-08-16 ENCOUNTER — Inpatient Hospital Stay (HOSPITAL_COMMUNITY): Payer: BC Managed Care – PPO

## 2019-08-16 LAB — CBC
HCT: 37.9 % — ABNORMAL LOW (ref 39.0–52.0)
Hemoglobin: 13 g/dL (ref 13.0–17.0)
MCH: 30.4 pg (ref 26.0–34.0)
MCHC: 34.3 g/dL (ref 30.0–36.0)
MCV: 88.8 fL (ref 80.0–100.0)
Platelets: 186 10*3/uL (ref 150–400)
RBC: 4.27 MIL/uL (ref 4.22–5.81)
RDW: 12.1 % (ref 11.5–15.5)
WBC: 8.4 10*3/uL (ref 4.0–10.5)
nRBC: 0 % (ref 0.0–0.2)

## 2019-08-16 LAB — BASIC METABOLIC PANEL
Anion gap: 9 (ref 5–15)
BUN: 15 mg/dL (ref 8–23)
CO2: 26 mmol/L (ref 22–32)
Calcium: 8.5 mg/dL — ABNORMAL LOW (ref 8.9–10.3)
Chloride: 104 mmol/L (ref 98–111)
Creatinine, Ser: 0.99 mg/dL (ref 0.61–1.24)
GFR calc Af Amer: >60 mL/min (ref >60)
GFR calc non Af Amer: >60 mL/min (ref >60)
Glucose, Bld: 97 mg/dL (ref 70–99)
Potassium: 3.8 mmol/L (ref 3.5–5.1)
Sodium: 139 mmol/L (ref 135–145)

## 2019-08-16 LAB — GLUCOSE, CAPILLARY: Glucose-Capillary: 97 mg/dL (ref 70–99)

## 2019-08-16 MED ORDER — PNEUMOCOCCAL VAC POLYVALENT 25 MCG/0.5ML IJ INJ
0.5000 mL | INJECTION | Freq: Once | INTRAMUSCULAR | Status: AC
  Administered 2019-08-16: 0.5 mL via INTRAMUSCULAR
  Filled 2019-08-16: qty 0.5

## 2019-08-16 MED ORDER — TRAMADOL HCL 50 MG PO TABS: 50.0000 mg | ORAL_TABLET | Freq: Four times a day (QID) | ORAL | 0 refills | Status: DC | PRN

## 2019-08-16 NOTE — Progress Notes (Addendum)
      [address]       [place] [address]             [phone]       1 Day Post-Op Procedure(s) (LRB): VIDEO BRONCHOSCOPY (N/A) RIGHT VIDEO ASSISTED THORACOSCOPY (VATS) WITH RIGHT LOWER LOBE WEDGE RESECTION (Right)  Subjective: Patient with not much pain this am.  Objective: Vital signs in last 24 hours: Temp:  [97.5 F (36.4 C)-99.1 F (37.3 C)] 97.8 F (36.6 C) (12/18 0335) Pulse Rate:  [64-100] 64 (12/18 0335) Cardiac Rhythm: Normal sinus rhythm (12/18 0330) Resp:  [11-22] 13 (12/18 0335) BP: (121-149)/(72-101) 121/80 (12/18 0335) SpO2:  [95 %-100 %] 96 % (12/18 0335) Arterial Line BP: (155-165)/(80-89) 165/80 (12/17 1030) Weight:  [71 kg] 71 kg (12/17 1110)      Intake/Output from previous day: 12/17 0701 - 12/18 0700 In: 2575.3 [P.O.:200; I.V.:2175.3; IV Piggyback:200] Out: 6063 [Urine:1850; Stool:400; Blood:75; Chest Tube:100]   Physical Exam:  Cardiovascular: RRR Pulmonary: Clear to auscultation bilaterally Abdomen: Soft, non tender, bowel sounds present. Extremities: SCDs in place Wounds: Clean and dry.  No erythema or signs of infection. Chest Tube: to suction, no air leak  Lab Results: CBC: Recent Labs    08/16/19 0500  WBC 8.4  HGB 13.0  HCT 37.9*  PLT 186   BMET:  Recent Labs    08/16/19 0500  NA 139  K 3.8  CL 104  CO2 26  GLUCOSE 97  BUN 15  CREATININE 0.99  CALCIUM 8.5*    PT/INR: No results for input(s): LABPROT, INR in the last 72 hours. ABG:  INR: Will add last result for INR, ABG once components are confirmed Will add last 4 CBG results once components are confirmed  Assessment/Plan:  1. CV - SR. 2.  Pulmonary - On room air this am. Chest tube with 100 cc since surgery. Chest tube is to suction and there is no air leak. CXR this am shows no pneumothorax. Encourage incentive spirometer 3. CBGs 109/123/97. No history of DM so will stop accu checks and SS PRN 4. Will likely remove chest tube, check  CXR again, and if CXR stable, likely discharge later today.  Nhan Qualley M ZimmermanPA-C 08/16/2019,7:05 AM [phone]

## 2019-08-16 NOTE — Plan of Care (Signed)
Problem: Education: Goal: Knowledge of General Education information will improve Description: Including pain rating scale, medication(s)/side effects and non-pharmacologic comfort measures 08/16/2019 1355 by Domingo Sep, RN Outcome: Adequate for Discharge 08/16/2019 1354 by Domingo Sep, RN Outcome: Adequate for Discharge   Problem: Health Behavior/Discharge Planning: Goal: Ability to manage health-related needs will improve 08/16/2019 1355 by Domingo Sep, RN Outcome: Adequate for Discharge 08/16/2019 1354 by Domingo Sep, RN Outcome: Adequate for Discharge   Problem: Clinical Measurements: Goal: Ability to maintain clinical measurements within normal limits will improve 08/16/2019 1355 by Domingo Sep, RN Outcome: Adequate for Discharge 08/16/2019 1354 by Domingo Sep, RN Outcome: Adequate for Discharge Goal: Will remain free from infection 08/16/2019 1355 by Domingo Sep, RN Outcome: Adequate for Discharge 08/16/2019 1354 by Domingo Sep, RN Outcome: Adequate for Discharge Goal: Diagnostic test results will improve 08/16/2019 1355 by Domingo Sep, RN Outcome: Adequate for Discharge 08/16/2019 1354 by Domingo Sep, RN Outcome: Adequate for Discharge Goal: Respiratory complications will improve 08/16/2019 1355 by Domingo Sep, RN Outcome: Adequate for Discharge 08/16/2019 1354 by Domingo Sep, RN Outcome: Adequate for Discharge Goal: Cardiovascular complication will be avoided 08/16/2019 1355 by Domingo Sep, RN Outcome: Adequate for Discharge 08/16/2019 1354 by Domingo Sep, RN Outcome: Adequate for Discharge   Problem: Activity: Goal: Risk for activity intolerance will decrease 08/16/2019 1355 by Domingo Sep, RN Outcome: Adequate for Discharge 08/16/2019 1354 by Domingo Sep, RN Outcome: Adequate for Discharge   Problem: Nutrition: Goal: Adequate nutrition will be  maintained 08/16/2019 1355 by Domingo Sep, RN Outcome: Adequate for Discharge 08/16/2019 1354 by Domingo Sep, RN Outcome: Adequate for Discharge   Problem: Coping: Goal: Level of anxiety will decrease 08/16/2019 1355 by Domingo Sep, RN Outcome: Adequate for Discharge 08/16/2019 1354 by Domingo Sep, RN Outcome: Adequate for Discharge   Problem: Elimination: Goal: Will not experience complications related to bowel motility 08/16/2019 1355 by Domingo Sep, RN Outcome: Adequate for Discharge 08/16/2019 1354 by Domingo Sep, RN Outcome: Adequate for Discharge Goal: Will not experience complications related to urinary retention 08/16/2019 1355 by Domingo Sep, RN Outcome: Adequate for Discharge 08/16/2019 1354 by Domingo Sep, RN Outcome: Adequate for Discharge   Problem: Pain Managment: Goal: General experience of comfort will improve 08/16/2019 1355 by Domingo Sep, RN Outcome: Adequate for Discharge 08/16/2019 1354 by Domingo Sep, RN Outcome: Adequate for Discharge   Problem: Safety: Goal: Ability to remain free from injury will improve 08/16/2019 1355 by Domingo Sep, RN Outcome: Adequate for Discharge 08/16/2019 1354 by Domingo Sep, RN Outcome: Adequate for Discharge   Problem: Skin Integrity: Goal: Risk for impaired skin integrity will decrease 08/16/2019 1355 by Domingo Sep, RN Outcome: Adequate for Discharge 08/16/2019 1354 by Domingo Sep, RN Outcome: Adequate for Discharge   Problem: Education: Goal: Knowledge of disease or condition will improve Outcome: Adequate for Discharge Goal: Knowledge of the prescribed therapeutic regimen will improve Outcome: Adequate for Discharge   Problem: Activity: Goal: Risk for activity intolerance will decrease Outcome: Adequate for Discharge   Problem: Cardiac: Goal: Will achieve and/or maintain hemodynamic stability Outcome:  Adequate for Discharge   Problem: Clinical Measurements: Goal: Postoperative complications will be avoided or minimized Outcome: Adequate for Discharge   Problem: Respiratory: Goal: Respiratory status will improve Outcome: Adequate for Discharge   Problem: Pain Management: Goal: Pain level will decrease Outcome: Adequate for Discharge  Problem: Skin Integrity: Goal: Wound healing without signs and symptoms infection will improve Outcome: Adequate for Discharge  Discharge instructions given to patient. Questions answered. Educated on new medication regimen, signs/symptoms, restrictions, and follow up appointments. Prescriptions sent to pharmacy. PIV DC, hemostasis achieved. Vital signs stable. All belongings sent home with patient.   Pt escorted by NT via wheelchair to private vehicle driven by spouse.

## 2019-08-19 LAB — SURGICAL PATHOLOGY

## 2019-08-21 ENCOUNTER — Ambulatory Visit (INDEPENDENT_AMBULATORY_CARE_PROVIDER_SITE_OTHER): Payer: Self-pay | Admitting: Thoracic Surgery (Cardiothoracic Vascular Surgery)

## 2019-08-21 ENCOUNTER — Encounter: Payer: Self-pay | Admitting: Thoracic Surgery (Cardiothoracic Vascular Surgery)

## 2019-08-21 ENCOUNTER — Other Ambulatory Visit: Payer: Self-pay

## 2019-08-21 ENCOUNTER — Ambulatory Visit: Payer: Self-pay | Admitting: Thoracic Surgery (Cardiothoracic Vascular Surgery)

## 2019-08-21 VITALS — BP 155/98 | HR 90 | Temp 97.8°F | Resp 20 | Ht 68.0 in | Wt 155.0 lb

## 2019-08-21 DIAGNOSIS — Z09 Encounter for follow-up examination after completed treatment for conditions other than malignant neoplasm: Secondary | ICD-10-CM

## 2019-08-21 DIAGNOSIS — R911 Solitary pulmonary nodule: Secondary | ICD-10-CM

## 2019-08-21 NOTE — Progress Notes (Signed)
      [address]       [place] [address]             [phone]        Bryan Hebert [facility] Medical Record #[id number] Date of Birth: 06-27-1956  Referring: Tanda Rockers, MD Primary Care: Patient, No Pcp Per Primary Cardiologist:No primary care provider on file.  Reason for visit:   follow-up  History of Present Illness:     Bryan Hebert present for his 1st follow-up after undergoing a Right VATS, wedge resection.  He has no complaints  Physical Exam: BP (!) 155/98   Pulse 90   Temp 97.8 F (36.6 C) (Skin)   Resp 20   Ht 5\' 8"  (1.727 m)   Wt 155 lb (70.3 kg)   SpO2 98% Comment: RA  BMI 23.57 kg/m   Alert NAD Incision clean.   Abdomen soft, ND no peripheral edema   Diagnostic Studies & Laboratory data:  Path: necrotizing granuloma, chronic inflammation    Assessment / Plan:   S/p wedge resection for pulmonary nodule.  No malignancy Will f/u in 1 month with CXR   Bryan Hebert 08/21/2019 2:42 PM

## 2019-09-18 ENCOUNTER — Other Ambulatory Visit: Payer: Self-pay | Admitting: Thoracic Surgery (Cardiothoracic Vascular Surgery)

## 2019-09-18 DIAGNOSIS — R911 Solitary pulmonary nodule: Secondary | ICD-10-CM

## 2019-09-20 ENCOUNTER — Encounter: Payer: Self-pay | Admitting: Thoracic Surgery (Cardiothoracic Vascular Surgery)

## 2019-09-20 ENCOUNTER — Other Ambulatory Visit: Payer: Self-pay

## 2019-09-20 ENCOUNTER — Ambulatory Visit (INDEPENDENT_AMBULATORY_CARE_PROVIDER_SITE_OTHER): Payer: Self-pay | Admitting: Thoracic Surgery (Cardiothoracic Vascular Surgery)

## 2019-09-20 ENCOUNTER — Ambulatory Visit
Admission: RE | Admit: 2019-09-20 | Discharge: 2019-09-20 | Disposition: A | Discharge status: Home / Self Care | Payer: BC Managed Care – PPO | Source: Ambulatory Visit | Attending: Thoracic Surgery (Cardiothoracic Vascular Surgery) | Admitting: Thoracic Surgery (Cardiothoracic Vascular Surgery)

## 2019-09-20 VITALS — BP 134/76 | HR 72 | Temp 97.7°F | Resp 16 | Ht 68.0 in | Wt 150.0 lb

## 2019-09-20 DIAGNOSIS — Z09 Encounter for follow-up examination after completed treatment for conditions other than malignant neoplasm: Secondary | ICD-10-CM

## 2019-09-20 DIAGNOSIS — R911 Solitary pulmonary nodule: Secondary | ICD-10-CM

## 2019-09-20 DIAGNOSIS — M3130 Wegener's granulomatosis without renal involvement: Secondary | ICD-10-CM

## 2019-09-20 NOTE — Progress Notes (Signed)
      [address]       [place] [address]             [phone]        Bryan Hebert [facility] Medical Record #[id number] Date of Birth: 12-10-1955  Referring: Nyoka Cowden, MD Primary Care: Patient, No Pcp Per Primary Cardiologist:No primary care provider on file.  Reason for visit:   follow-up  History of Present Illness:     Bryan Hebert comes in for his 1 month follow-up.  He is back to work, and has no symptoms.    Physical Exam: BP 134/76 (BP Location: Left Arm, Patient Position: Sitting, Cuff Size: Normal)   Pulse 72   Temp 97.7 F (36.5 C)   Resp 16   Ht 5\' 8"  (1.727 m)   Wt 150 lb (68 kg)   SpO2 97% Comment: RA  BMI 22.81 kg/m   Alert NAD Incision clean.  Abdomen soft, ND no peripheral edema   Diagnostic Studies & Laboratory data: CXR: clear Path:   A. LUNG, LOWER RIGHT, WEDGE RESECTION:  - Necrotizing granulomas with chronic inflammation.  - No malignancy identified.     Assessment / Plan:   64 yo male s/p LLL wedge resection for necrotizing granuloma Doing well F/u prn   77 09/20/2019 1:26 PM

## 2019-10-11 IMAGING — MR MR CERVICAL SPINE WO/W CM
5 of 8 series · 27 of 48 positions shown · IV contrast (gadavist)
Comparison: 06/07/2018 CT scan

CLINICAL DATA: Thrown from a horse landing on the right arm. Right
arm numbness in shoulder pain

EXAM:
MRI CERVICAL SPINE WITHOUT AND WITH CONTRAST
TECHNIQUE: Multiplanar and multiecho pulse sequences of the cervical spine, to
include the craniocervical junction and cervicothoracic junction,
were obtained without and with intravenous contrast.
CONTRAST:  7 cc Gadavist

[Series 5: T2 · sagittal · 3.0mm · 0.69mm/px · 4 of 15 slices shown (1 of 2)]
[im 1/15]
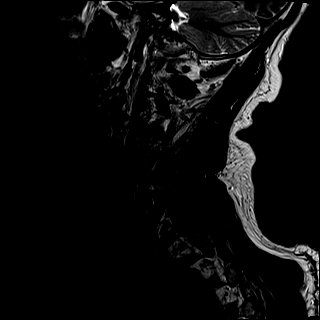
[im 5/15]
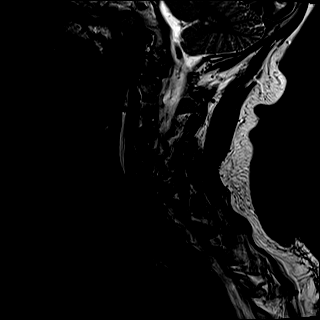
[im 10/15]
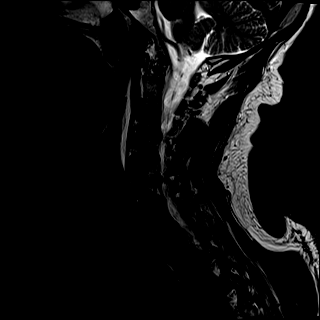
[im 15/15]
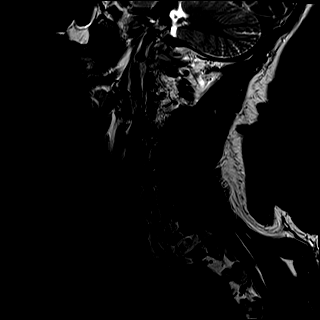

[Series 6: T1 · sagittal · 3.0mm · 0.69mm/px · 4 of 15 slices shown (1 of 2)]
[im 1/15]
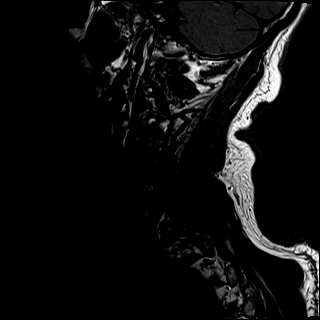
[im 5/15]
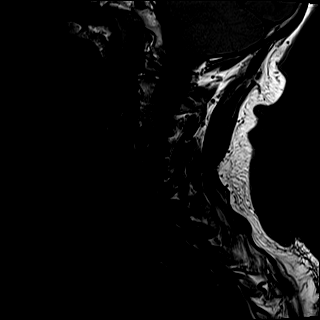
[im 10/15]
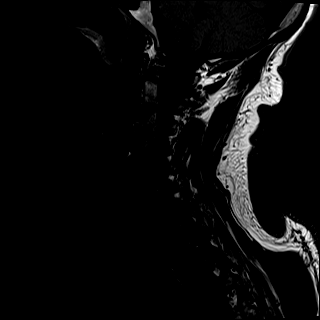
[im 15/15]
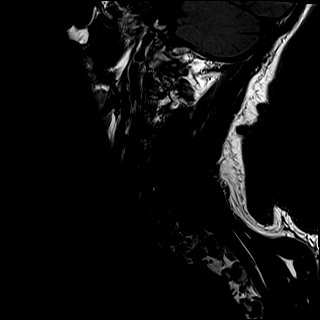

[Series 8: T2 · axial · 3.0mm · 0.66mm/px · z∈[-111,-4]mm · 8 of 34 slices shown (2 of 2)]
[im 1/34]
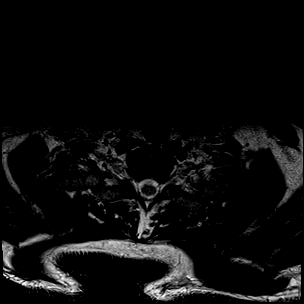
[im 5/34]
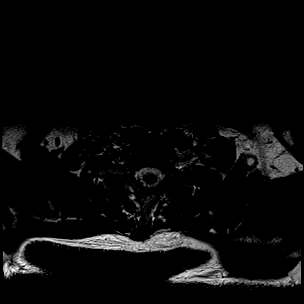
[im 10/34]
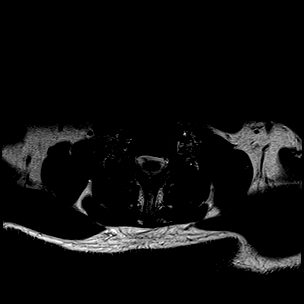
[im 15/34]
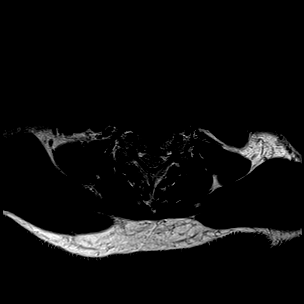
[im 19/34]
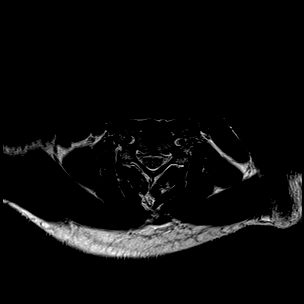
[im 24/34]
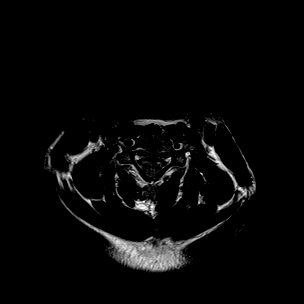
[im 29/34]
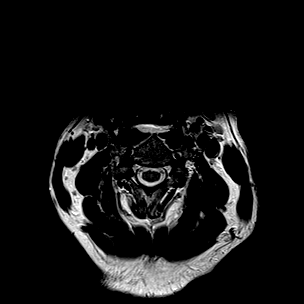
[im 34/34]
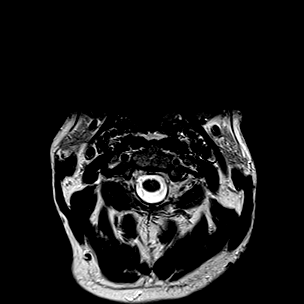

[Series 10: T1 · axial · 3.0mm · 0.39mm/px · z∈[-111,-4]mm · 8 of 34 slices shown (2 of 2)]
[im 1/34]
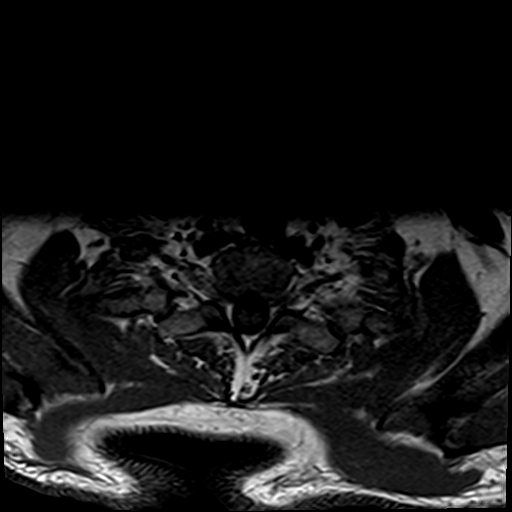
[im 5/34]
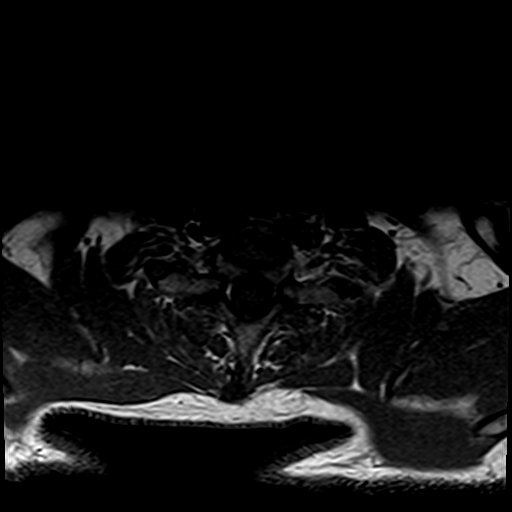
[im 10/34]
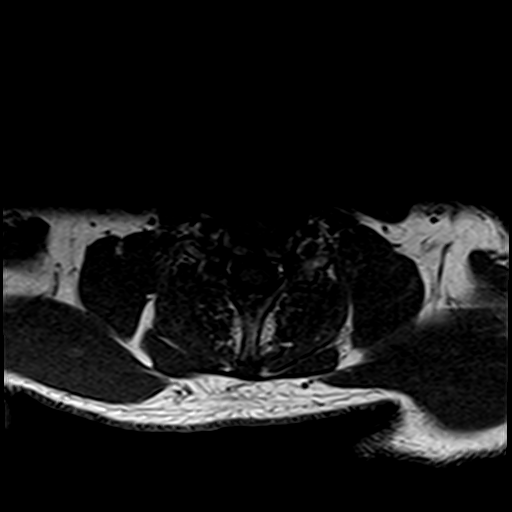
[im 15/34]
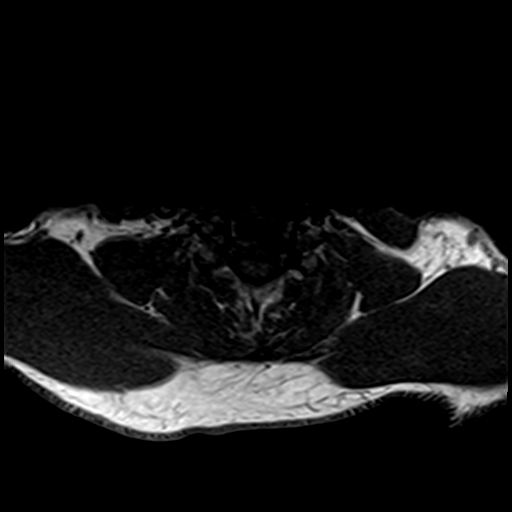
[im 19/34]
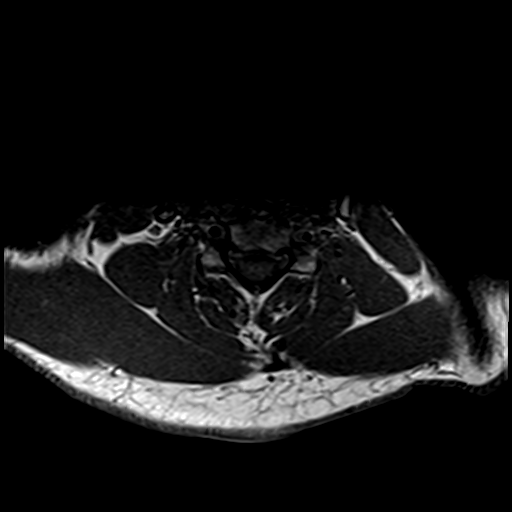
[im 24/34]
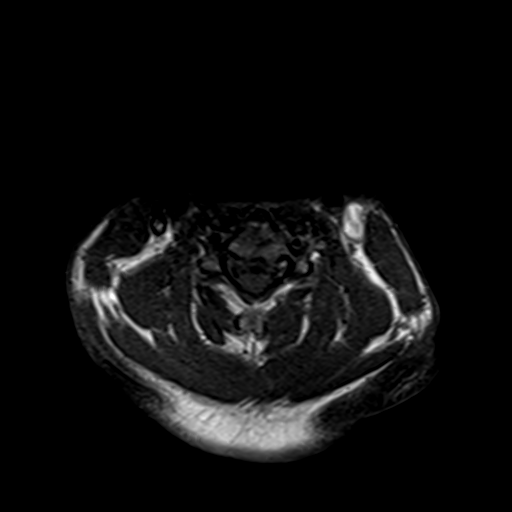
[im 29/34]
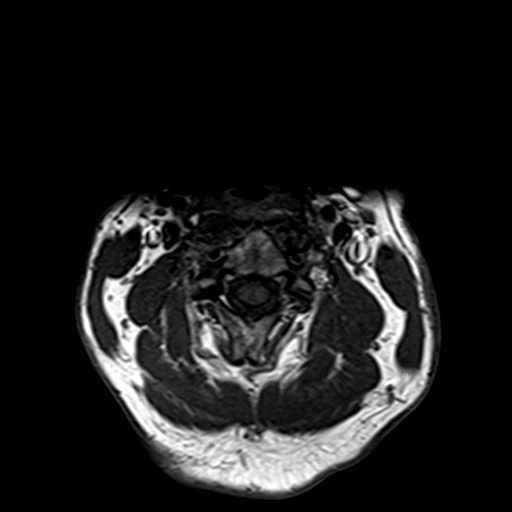
[im 34/34]
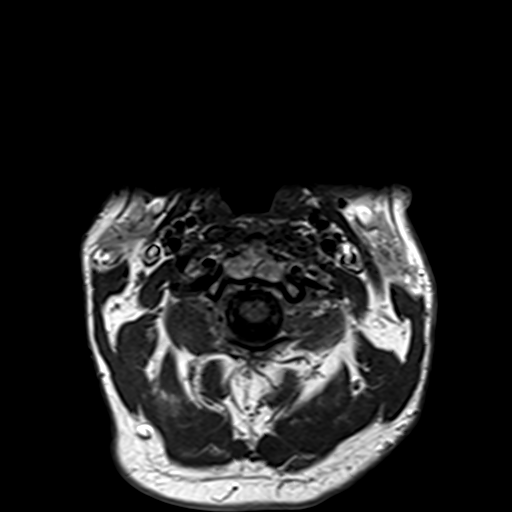

[Series 11: T1 fat-sat post-contrast · sagittal · 3.0mm · 0.43mm/px · 3 of 15 slices shown]
[im 1/15]
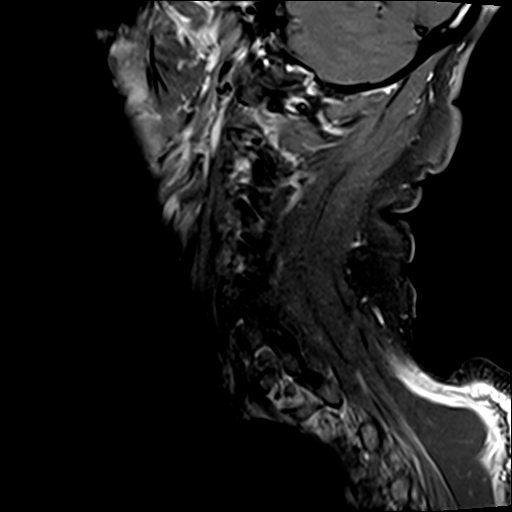
[im 5/15]
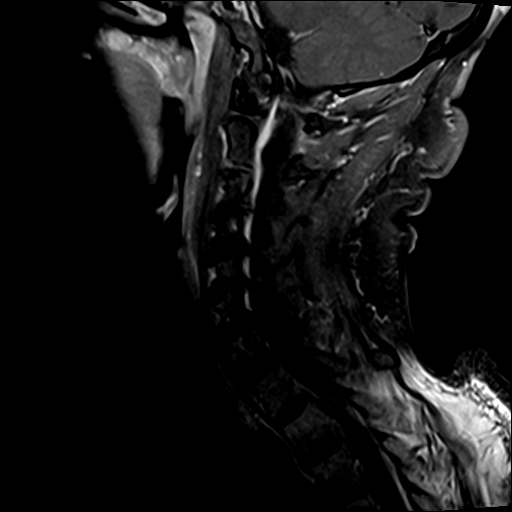
[im 10/15]
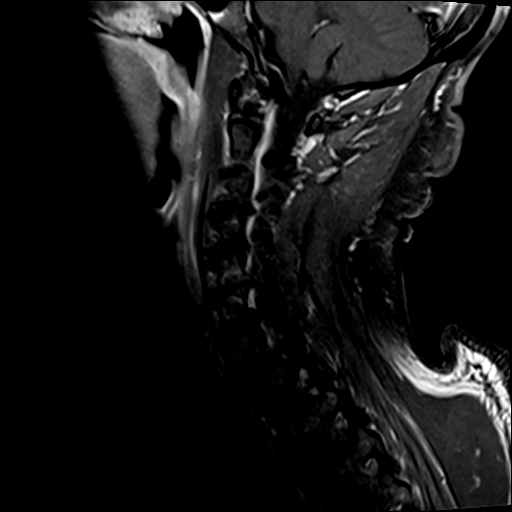

[27 of 48 positions shown; findings below may reference images not displayed]

FINDINGS: Alignment: No vertebral subluxation is observed.

Vertebrae: Type 1 degenerative endplate findings eccentric to the
left at C5-6 posteriorly. Associated subtle endplate enhancement. No
appreciable fracture.

Cord: Abnormal central T2 signal without overt enhancement centrally
along the gray matter of the cord at the C3-4 level, relatively
bilaterally symmetric. The cord appears otherwise unremarkable.

Posterior Fossa, vertebral arteries, paraspinal tissues:
Prevertebral edema/fluid signal extending from the C1-2 level down
to the C5 level. Supraspinous ligament edema tracking between C2,
C3, and C4, with potential interspinous edema also at C2-3, C3-4,
and questionably C4-5.

Disc levels:

C2-3: Unremarkable.

C3-4: Prominent bilateral foraminal impingement and moderate central
narrowing of the thecal sac due to central disc protrusion, uncinate
spurring, and facet arthropathy. This is the level with central cord
edema signal. Small right facet effusion. Possible left ligamentum
flavum injury posteriorly at this level.

C4-5: Prominent right and moderate to prominent left foraminal
stenosis with borderline central narrowing of the thecal sac due to
disc bulge, uncinate spurring, and facet arthropathy.

C5-6: Prominent bilateral foraminal stenosis and moderate central
narrowing of the thecal sac due to central disc protrusion, uncinate
spurring, and facet arthropathy.

C6-7: Prominent left and moderate to prominent right foraminal
stenosis due to uncinate spurring, disc bulge, and facet arthropathy
along with potential left foraminal disc protrusion.

C7-T1: No impingement.  Mild bilateral facet arthropathy.
IMPRESSION: 1. Suspected ligamentous injuries in the upper cervical spine most
especially at C3-4 where there is prevertebral edema, a small right
facet effusion, suspected ligamentum flavum irregularity/tear,
interspinous edema suggesting interspinous tear, and edema along the
supraspinous ligament which may well be injured. This is a level of
particular significance in this case because there is also some
central cord edema/contusion. No overt subluxation.
2. There is also edema along the supraspinous ligament at the C2-3
level, and possible interspinous edema at C2-3. The prevertebral
edema extends all the way from C1 down to C5, and may be an
indicator of an indistinct or occult anterior longitudinal ligament
tear.
3. Cervical spondylosis and degenerative disc disease causing
prominent levels of impingement at C3-4, C4-5, C5-6, and C6-7
primarily from spurring and degenerative disc disease.

## 2023-08-07 DIAGNOSIS — K080 Exfoliation of teeth due to systemic causes: Secondary | ICD-10-CM | POA: Diagnosis not present

## 2023-09-07 DIAGNOSIS — Z6825 Body mass index (BMI) 25.0-25.9, adult: Secondary | ICD-10-CM | POA: Diagnosis not present

## 2023-09-07 DIAGNOSIS — R509 Fever, unspecified: Secondary | ICD-10-CM | POA: Diagnosis not present

## 2023-09-10 DIAGNOSIS — M79601 Pain in right arm: Secondary | ICD-10-CM | POA: Diagnosis not present

## 2023-09-10 DIAGNOSIS — S52614A Nondisplaced fracture of right ulna styloid process, initial encounter for closed fracture: Secondary | ICD-10-CM | POA: Diagnosis not present

## 2023-09-13 DIAGNOSIS — S52601A Unspecified fracture of lower end of right ulna, initial encounter for closed fracture: Secondary | ICD-10-CM | POA: Diagnosis not present

## 2023-09-18 DIAGNOSIS — Z13220 Encounter for screening for lipoid disorders: Secondary | ICD-10-CM | POA: Diagnosis not present

## 2023-09-18 DIAGNOSIS — Z131 Encounter for screening for diabetes mellitus: Secondary | ICD-10-CM | POA: Diagnosis not present

## 2023-09-18 DIAGNOSIS — Z0000 Encounter for general adult medical examination without abnormal findings: Secondary | ICD-10-CM | POA: Diagnosis not present

## 2023-09-18 DIAGNOSIS — S5291XA Unspecified fracture of right forearm, initial encounter for closed fracture: Secondary | ICD-10-CM | POA: Diagnosis not present

## 2023-09-18 DIAGNOSIS — Z130 Encounter for screening for diseases of the blood and blood-forming organs and certain disorders involving the immune mechanism: Secondary | ICD-10-CM | POA: Diagnosis not present

## 2023-09-18 DIAGNOSIS — Z1211 Encounter for screening for malignant neoplasm of colon: Secondary | ICD-10-CM | POA: Diagnosis not present

## 2023-09-22 DIAGNOSIS — N401 Enlarged prostate with lower urinary tract symptoms: Secondary | ICD-10-CM | POA: Diagnosis not present

## 2023-09-22 DIAGNOSIS — R310 Gross hematuria: Secondary | ICD-10-CM | POA: Diagnosis not present

## 2023-09-22 DIAGNOSIS — W5512XA Struck by horse, initial encounter: Secondary | ICD-10-CM | POA: Diagnosis not present

## 2023-09-22 DIAGNOSIS — S52291A Other fracture of shaft of right ulna, initial encounter for closed fracture: Secondary | ICD-10-CM | POA: Diagnosis not present

## 2023-09-22 DIAGNOSIS — S52251A Displaced comminuted fracture of shaft of ulna, right arm, initial encounter for closed fracture: Secondary | ICD-10-CM | POA: Diagnosis not present

## 2023-09-22 DIAGNOSIS — G8918 Other acute postprocedural pain: Secondary | ICD-10-CM | POA: Diagnosis not present

## 2023-09-22 DIAGNOSIS — Z79899 Other long term (current) drug therapy: Secondary | ICD-10-CM | POA: Diagnosis not present

## 2023-09-22 DIAGNOSIS — S52601A Unspecified fracture of lower end of right ulna, initial encounter for closed fracture: Secondary | ICD-10-CM | POA: Diagnosis not present

## 2023-10-02 DIAGNOSIS — S52601A Unspecified fracture of lower end of right ulna, initial encounter for closed fracture: Secondary | ICD-10-CM | POA: Diagnosis not present

## 2023-10-16 DIAGNOSIS — S52601A Unspecified fracture of lower end of right ulna, initial encounter for closed fracture: Secondary | ICD-10-CM | POA: Diagnosis not present

## 2023-10-25 DIAGNOSIS — S8012XA Contusion of left lower leg, initial encounter: Secondary | ICD-10-CM | POA: Diagnosis not present

## 2023-10-25 DIAGNOSIS — M7062 Trochanteric bursitis, left hip: Secondary | ICD-10-CM | POA: Diagnosis not present

## 2023-11-06 DIAGNOSIS — S52601A Unspecified fracture of lower end of right ulna, initial encounter for closed fracture: Secondary | ICD-10-CM | POA: Diagnosis not present

## 2023-11-06 DIAGNOSIS — G5601 Carpal tunnel syndrome, right upper limb: Secondary | ICD-10-CM | POA: Diagnosis not present

## 2023-12-04 DIAGNOSIS — G5601 Carpal tunnel syndrome, right upper limb: Secondary | ICD-10-CM | POA: Diagnosis not present

## 2023-12-04 DIAGNOSIS — S52601A Unspecified fracture of lower end of right ulna, initial encounter for closed fracture: Secondary | ICD-10-CM | POA: Diagnosis not present

## 2024-02-22 DIAGNOSIS — K080 Exfoliation of teeth due to systemic causes: Secondary | ICD-10-CM | POA: Diagnosis not present

## 2024-02-28 DIAGNOSIS — K080 Exfoliation of teeth due to systemic causes: Secondary | ICD-10-CM | POA: Diagnosis not present

## 2024-05-03 DIAGNOSIS — L814 Other melanin hyperpigmentation: Secondary | ICD-10-CM | POA: Diagnosis not present

## 2024-05-03 DIAGNOSIS — L821 Other seborrheic keratosis: Secondary | ICD-10-CM | POA: Diagnosis not present

## 2024-05-03 DIAGNOSIS — L570 Actinic keratosis: Secondary | ICD-10-CM | POA: Diagnosis not present

## 2024-05-03 DIAGNOSIS — D225 Melanocytic nevi of trunk: Secondary | ICD-10-CM | POA: Diagnosis not present

## 2024-05-03 DIAGNOSIS — D2239 Melanocytic nevi of other parts of face: Secondary | ICD-10-CM | POA: Diagnosis not present

## 2024-07-22 ENCOUNTER — Ambulatory Visit (AMBULATORY_SURGERY_CENTER): Payer: Self-pay

## 2024-07-22 VITALS — Ht 68.0 in | Wt 155.0 lb

## 2024-07-22 DIAGNOSIS — Z1211 Encounter for screening for malignant neoplasm of colon: Secondary | ICD-10-CM

## 2024-07-22 MED ORDER — NA SULFATE-K SULFATE-MG SULF 17.5-3.13-1.6 GM/177ML PO SOLN: 1.0000 | Freq: Once | ORAL | 0 refills | Status: AC

## 2024-07-22 NOTE — Progress Notes (Signed)
 PCP MD at time of PV: Amy Dickinson, PA-C __________________________________________________________________________________________________________________________________________  No egg allergy known to patient  No soy allergy known to patient No issues known to pt with past sedation with any surgeries or procedures Patient denies ever being told they had issues or difficulty with intubation  No FH of Malignant Hyperthermia Pt is not on diet pills Pt is not on  home 02  Pt is not on blood thinners  No A fib or A flutter Have any cardiac testing pending--no  LOA: independent No Chew or Snuff tobacco __________________________________________________________________________________________________________________________________________  Constipation: no  Prep: suprep  __________________________________________________________________________________________________________________________________________  PV completed with patient. Prep instructions reviewed and provided during apt. Rx sent to preferred pharmacy.  __________________________________________________________________________________________________________________________________________  Patient's chart reviewed by Norleen Schillings CNRA prior to previsit and patient appropriate for the [facility].  Previsit completed and red dot placed by patient's name on their procedure day (on provider's schedule).

## 2024-08-02 ENCOUNTER — Telehealth: Payer: Self-pay | Admitting: Gastroenterology

## 2024-08-02 NOTE — Telephone Encounter (Signed)
 Patient did not receive prep instructions. Not enough time to mail Doesn't use MyChart or email. Reviewed instructions, patient able to repeat back. Will call if he has any additional  questions.

## 2024-08-02 NOTE — Telephone Encounter (Signed)
 Patient called stating he has not yet received prep instructions in the mail. Requesting a call back. Please advise.

## 2024-08-02 NOTE — Telephone Encounter (Signed)
 Attempted to reach patient- unable to speak with patient-LMTCB to the office with number provided;

## 2024-08-02 NOTE — Telephone Encounter (Signed)
 Prep instructions sent through secure email to  [email or web address]

## 2024-08-02 NOTE — Telephone Encounter (Signed)
 Instructions printed and placed on 2nd floor with receptionist for patient to pick up if he calls back; patient is not currently using MyChart = as information can be sent to him in that platform if he signs up in his MyChart;

## 2024-08-02 NOTE — Telephone Encounter (Signed)
 Patient returning call wondering if prep instructions can be emailed to him at [email or web address] Please advise  Thank you

## 2024-08-07 ENCOUNTER — Encounter: Payer: Self-pay | Admitting: Gastroenterology

## 2024-08-07 ENCOUNTER — Ambulatory Visit: Admitting: Gastroenterology

## 2024-08-07 VITALS — BP 129/78 | HR 72 | Temp 98.1°F | Resp 10 | Ht 68.0 in | Wt 155.0 lb

## 2024-08-07 DIAGNOSIS — D123 Benign neoplasm of transverse colon: Secondary | ICD-10-CM | POA: Diagnosis not present

## 2024-08-07 DIAGNOSIS — K640 First degree hemorrhoids: Secondary | ICD-10-CM

## 2024-08-07 DIAGNOSIS — Z1211 Encounter for screening for malignant neoplasm of colon: Secondary | ICD-10-CM

## 2024-08-07 DIAGNOSIS — D127 Benign neoplasm of rectosigmoid junction: Secondary | ICD-10-CM

## 2024-08-07 DIAGNOSIS — K5730 Diverticulosis of large intestine without perforation or abscess without bleeding: Secondary | ICD-10-CM | POA: Diagnosis not present

## 2024-08-07 MED ORDER — SODIUM CHLORIDE 0.9 % IV SOLN: 500.0000 mL | Freq: Once | INTRAVENOUS | Status: DC

## 2024-08-07 NOTE — Progress Notes (Signed)
 [facility] History and Physical   Primary Care Physician:  Fritz Greig DEL, PA-C   Reason for Procedure:   CRC screening  Plan:     colonoscopy   The patient was provided an opportunity to ask questions and all were answered. The patient agreed with the plan.   HPI: Bryan Hebert is a 68 y.o. male    History reviewed. No pertinent past medical history.  Past Surgical History:  Procedure Laterality Date   APPENDECTOMY     HERNIA REPAIR     VIDEO ASSISTED THORACOSCOPY (VATS)/WEDGE RESECTION Right 08/15/2019   Procedure: RIGHT VIDEO ASSISTED THORACOSCOPY (VATS) WITH RIGHT LOWER LOBE WEDGE RESECTION;  Surgeon: Shyrl Linnie KIDD, MD;  Location: [facility] OR;  Service: Thoracic;  Laterality: Right;   VIDEO BRONCHOSCOPY N/A 08/15/2019   Procedure: VIDEO BRONCHOSCOPY;  Surgeon: Shyrl Linnie KIDD, MD;  Location: [facility] OR;  Service: Thoracic;  Laterality: N/A;    Prior to Admission medications   Medication Sig Start Date End Date Taking? Authorizing Provider  APPLE CIDER VINEGAR PO Take 1 Capful by mouth daily.   Yes [provider]  Multiple Vitamins-Minerals (MULTIVITAMIN WITH MINERALS) tablet Take 1 tablet by mouth daily.   Yes [provider]  Omega-3 Fatty Acids (FISH OIL) 1000 MG CAPS Take 1,000 mg by mouth daily.   Yes [provider]  Turmeric Curcumin CAPS Take by mouth daily.   Yes [provider]    Current Outpatient Medications  Medication Sig Dispense Refill   APPLE CIDER VINEGAR PO Take 1 Capful by mouth daily.     Multiple Vitamins-Minerals (MULTIVITAMIN WITH MINERALS) tablet Take 1 tablet by mouth daily.     Omega-3 Fatty Acids (FISH OIL) 1000 MG CAPS Take 1,000 mg by mouth daily.     Turmeric Curcumin CAPS Take by mouth daily.     Current Facility-Administered Medications  Medication Dose Route Frequency Provider Last Rate Last Admin   0.9 %  sodium chloride  infusion  500 mL Intravenous Once Charlanne Groom, MD         Allergies as of 08/07/2024   (No Known Allergies)    Family History  Problem Relation Age of Onset   Lung cancer Father    Colon cancer Neg Hx    Rectal cancer Neg Hx    Stomach cancer Neg Hx    Esophageal cancer Neg Hx     Social History   Socioeconomic History   Marital status: Married    Spouse name: Not on file   Number of children: Not on file   Years of education: Not on file   Highest education level: Not on file  Occupational History   Not on file  Tobacco Use   Smoking status: Never   Smokeless tobacco: Never  Vaping Use   Vaping status: Never Used  Substance and Sexual Activity   Alcohol use: Not Currently   Drug use: Never   Sexual activity: Not on file  Other Topics Concern   Not on file  Social History Narrative   Not on file   Social Drivers of Health   Financial Resource Strain: Not on file  Food Insecurity: Not on file  Transportation Needs: Not on file  Physical Activity: Not on file  Stress: Not on file  Social Connections: Not on file  Intimate Partner Violence: Not on file    Review of Systems: Positive for none All other review of systems negative except as mentioned in the HPI.  Physical  Exam: Vital signs in last 24 hours: @VSRANGES @   General:   Alert,  Well-developed, well-nourished, pleasant and cooperative in NAD Lungs:  Clear throughout to auscultation.   Heart:  Regular rate and rhythm; no murmurs, clicks, rubs,  or gallops. Abdomen:  Soft, nontender and nondistended. Normal bowel sounds.   Neuro/Psych:  Alert and cooperative. Normal mood and affect. A and O x 3    No significant changes were identified.  The patient continues to be an appropriate candidate for the planned procedure and anesthesia.   Anselm Bring, MD. [facility] 08/07/2024 11:04 AM@

## 2024-08-07 NOTE — Patient Instructions (Addendum)
-  Await pathology results -Handout on polyps, diverticulosis and hemorrhoids provided -No ASA (aspirin) or NSAIDs (like ibuprofen) for 5 days  YOU HAD AN ENDOSCOPIC PROCEDURE TODAY AT THE [facility]:   Refer to the procedure report that was given to you for any specific questions about what was found during the examination.  If the procedure report does not answer your questions, please call your gastroenterologist to clarify.  If you requested that your care partner not be given the details of your procedure findings, then the procedure report has been included in a sealed envelope for you to review at your convenience later.  YOU SHOULD EXPECT: Some feelings of bloating in the abdomen. Passage of more gas than usual.  Walking can help get rid of the air that was put into your GI tract during the procedure and reduce the bloating. If you had a lower endoscopy (such as a colonoscopy or flexible sigmoidoscopy) you may notice spotting of blood in your stool or on the toilet paper. If you underwent a bowel prep for your procedure, you may not have a normal bowel movement for a few days.  Please Note:  You might notice some irritation and congestion in your nose or some drainage.  This is from the oxygen used during your procedure.  There is no need for concern and it should clear up in a day or so.  SYMPTOMS TO REPORT IMMEDIATELY:  Following lower endoscopy (colonoscopy or flexible sigmoidoscopy):  Excessive amounts of blood in the stool  Significant tenderness or worsening of abdominal pains  Swelling of the abdomen that is new, acute  Fever of 100F or higher  For urgent or emergent issues, a gastroenterologist can be reached at any hour by calling [phone]. Do not use MyChart messaging for urgent concerns.    DIET:  We do recommend a small meal at first, but then you may proceed to your regular diet.  Drink plenty of fluids but you should avoid alcoholic beverages for 24  hours.  ACTIVITY:  You should plan to take it easy for the rest of today and you should NOT DRIVE or use heavy machinery until tomorrow (because of the sedation medicines used during the test).    FOLLOW UP: Our staff will call the number listed on your records the next business day following your procedure.  We will call around 7:15- 8:00 am to check on you and address any questions or concerns that you may have regarding the information given to you following your procedure. If we do not reach you, we will leave a message.     If any biopsies were taken you will be contacted by phone or by letter within the next 1-3 weeks.  Please call us  at [phone] if you have not heard about the biopsies in 3 weeks.    SIGNATURES/CONFIDENTIALITY: You and/or your care partner have signed paperwork which will be entered into your electronic medical record.  These signatures attest to the fact that that the information above on your After Visit Summary has been reviewed and is understood.  Full responsibility of the confidentiality of this discharge information lies with you and/or your care-partner.

## 2024-08-07 NOTE — Op Note (Signed)
  [facility] Patient Name: Bryan Hebert Procedure Date: 08/07/2024 11:02 AM MRN: [id number] Endoscopist: Lynnie Bring , MD, [id number] Age: 68 Referring MD:  Date of Birth: Jul 13, 1956 Gender: Male Account #: [id number] Procedure:                Colonoscopy Indications:              Screening for colorectal malignant neoplasm Medicines:                Monitored Anesthesia Care Procedure:                Pre-Anesthesia Assessment:                           - Prior to the procedure, a History and Physical                            was performed, and patient medications and                            allergies were reviewed. The patient's tolerance of                            previous anesthesia was also reviewed. The risks                            and benefits of the procedure and the sedation                            options and risks were discussed with the patient.                            All questions were answered, and informed consent                            was obtained. Prior Anticoagulants: The patient has                            taken no anticoagulant or antiplatelet agents. ASA                            Grade Assessment: II - A patient with mild systemic                            disease. After reviewing the risks and benefits,                            the patient was deemed in satisfactory condition to                            undergo the procedure.                           After obtaining informed consent, the colonoscope  was passed under direct vision. Throughout the                            procedure, the patient's blood pressure, pulse, and                            oxygen saturations were monitored continuously. The                            Olympus Scope [id number] was introduced through the                            anus and advanced to the the cecum, identified by                            appendiceal  orifice and ileocecal valve. The                            colonoscopy was performed without difficulty. The                            patient tolerated the procedure well. The quality                            of the bowel preparation was good. The ileocecal                            valve, appendiceal orifice, and rectum were                            photographed. Scope In: 11:09:26 AM Scope Out: 11:24:51 AM Scope Withdrawal Time: 0 hours 12 minutes 33 seconds  Total Procedure Duration: 0 hours 15 minutes 25 seconds  Findings:                 A 20 mm polyp was found in the recto-sigmoid colon,                            15 cm from the anal verge. The polyp was                            semi-sessile. The polyp was removed with a hot                            snare. Resection and retrieval were complete.                            Estimated blood loss: none.                           A 4 mm polyp was found in the mid transverse colon.                            The polyp was sessile. The  polyp was removed with a                            cold snare. Resection and retrieval were complete.                           A few small-mouthed diverticula were found in the                            sigmoid colon and few in asc colon.                           Non-bleeding internal hemorrhoids were found during                            retroflexion. The hemorrhoids were small and Grade                            I (internal hemorrhoids that do not prolapse).                           Retroflexion in the right colon was performed.                           The exam was otherwise without abnormality on                            direct and retroflexion views. Complications:            No immediate complications. Estimated Blood Loss:     Estimated blood loss: none. Impression:               - One 20 mm polyp at the recto-sigmoid colon,                            removed with a hot snare.  Resected and retrieved.                           - One 4 mm polyp in the mid transverse colon,                            removed with a cold snare. Resected and retrieved.                           - Pancolonic diverticulosis predominantly in the                            sigmoid colon.                           - Non-bleeding internal hemorrhoids.                           - The examination was otherwise normal on direct  and retroflexion views. Recommendation:           - Patient has a contact number available for                            emergencies. The signs and symptoms of potential                            delayed complications were discussed with the                            patient. Return to normal activities tomorrow.                            Written discharge instructions were provided to the                            patient. Higher than baseline risk for                            postpolypectomy bleeding.                           - Resume previous diet.                           - Continue present medications.                           - Await pathology results.                           - Repeat colonoscopy for surveillance based on                            pathology results.                           - No ASA, NSAIDs x 5 days                           - The findings and recommendations were discussed                            with the patient's family. Lynnie Bring, MD 08/07/2024 11:40:11 AM This report has been signed electronically.

## 2024-08-07 NOTE — Progress Notes (Signed)
 Called to room to assist during endoscopic procedure.  Patient ID and intended procedure confirmed with present staff. Received instructions for my participation in the procedure from the performing physician.

## 2024-08-07 NOTE — Progress Notes (Signed)
 Pt's states no medical or surgical changes since previsit or office visit.

## 2024-08-07 NOTE — Progress Notes (Signed)
 A/o x 3, VSS, good SR's, pleased with anesthesia, report to RN

## 2024-08-08 ENCOUNTER — Telehealth: Payer: Self-pay | Admitting: *Deleted

## 2024-08-08 NOTE — Telephone Encounter (Signed)
°  Follow up Call-     08/07/2024   10:13 AM  Call back number  Post procedure Call Back phone  # [phone]  Permission to leave phone message Yes     Patient questions:  Do you have a fever, pain , or abdominal swelling? No. Pain Score  0 *  Have you tolerated food without any problems? Yes.    Have you been able to return to your normal activities? Yes.    Do you have any questions about your discharge instructions: Diet   No. Medications  No. Follow up visit  No.  Do you have questions or concerns about your Care? No.  Actions: * If pain score is 4 or above: No action needed, pain <4.

## 2024-08-12 LAB — SURGICAL PATHOLOGY

## 2024-08-13 ENCOUNTER — Ambulatory Visit: Payer: Self-pay | Admitting: Gastroenterology

## 2024-08-24 ENCOUNTER — Encounter (HOSPITAL_BASED_OUTPATIENT_CLINIC_OR_DEPARTMENT_OTHER): Payer: Self-pay

## 2024-08-24 ENCOUNTER — Ambulatory Visit (HOSPITAL_BASED_OUTPATIENT_CLINIC_OR_DEPARTMENT_OTHER)
Admission: RE | Admit: 2024-08-24 | Discharge: 2024-08-24 | Disposition: A | Discharge status: Home / Self Care | Source: Ambulatory Visit | Attending: Family Medicine | Admitting: Family Medicine

## 2024-08-24 VITALS — BP 143/90 | HR 112 | Temp 98.8°F | Resp 18

## 2024-08-24 DIAGNOSIS — R051 Acute cough: Secondary | ICD-10-CM

## 2024-08-24 DIAGNOSIS — R509 Fever, unspecified: Secondary | ICD-10-CM

## 2024-08-24 DIAGNOSIS — J101 Influenza due to other identified influenza virus with other respiratory manifestations: Secondary | ICD-10-CM | POA: Diagnosis not present

## 2024-08-24 LAB — POCT INFLUENZA A/B
Influenza A, POC: POSITIVE — AB
Influenza B, POC: NEGATIVE

## 2024-08-24 MED ORDER — OSELTAMIVIR PHOSPHATE 75 MG PO CAPS: 75.0000 mg | ORAL_CAPSULE | Freq: Two times a day (BID) | ORAL | 0 refills | Status: AC

## 2024-08-24 MED ORDER — PROMETHAZINE-DM 6.25-15 MG/5ML PO SYRP: 5.0000 mL | ORAL_SOLUTION | Freq: Four times a day (QID) | ORAL | 0 refills | Status: AC | PRN

## 2024-08-24 NOTE — Discharge Instructions (Addendum)
 Influenza type a with cough and fever: Tamiflu  75 mg twice daily for 5 days.  Promethazine  DM, 5 mL, every 6 hours if needed for cough.  Get plenty of fluids and rest.  Work excuse provided.  Follow-up if symptoms do not improve, worsen or new symptoms occur.

## 2024-08-24 NOTE — ED Provider Notes (Signed)
 " [facility]    CSN: [id number] Arrival date & time: 08/24/24  9147      History   Chief Complaint No chief complaint on file.   HPI Bryan Hebert is a 68 y.o. male.   68 year old male with complaint of cough, runny nose, head congestion and fever.  Symptoms started on the evening of 08/21/2024.     History reviewed. No pertinent past medical history.  Patient Active Problem List   Diagnosis Date Noted   Right lower lobe pulmonary nodule 08/15/2019   Solitary pulmonary nodule on lung CT 07/10/2019    Past Surgical History:  Procedure Laterality Date   APPENDECTOMY     HERNIA REPAIR     VIDEO ASSISTED THORACOSCOPY (VATS)/WEDGE RESECTION Right 08/15/2019   Procedure: RIGHT VIDEO ASSISTED THORACOSCOPY (VATS) WITH RIGHT LOWER LOBE WEDGE RESECTION;  Surgeon: Shyrl Linnie KIDD, MD;  Location: [facility] OR;  Service: Thoracic;  Laterality: Right;   VIDEO BRONCHOSCOPY N/A 08/15/2019   Procedure: VIDEO BRONCHOSCOPY;  Surgeon: Shyrl Linnie KIDD, MD;  Location: [facility] OR;  Service: Thoracic;  Laterality: N/A;       Home Medications    Prior to Admission medications  Medication Sig Start Date End Date Taking? Authorizing Provider  APPLE CIDER VINEGAR PO Take 1 Capful by mouth daily.   Yes [provider]  Multiple Vitamins-Minerals (MULTIVITAMIN WITH MINERALS) tablet Take 1 tablet by mouth daily.   Yes [provider]  Omega-3 Fatty Acids (FISH OIL) 1000 MG CAPS Take 1,000 mg by mouth daily.   Yes [provider]  oseltamivir  (TAMIFLU ) 75 MG capsule Take 1 capsule (75 mg total) by mouth every 12 (twelve) hours. 08/24/24  Yes Ival Domino, FNP  promethazine -dextromethorphan (PROMETHAZINE -DM) 6.25-15 MG/5ML syrup Take 5 mLs by mouth 4 (four) times daily as needed for cough. Do not use and drive - May make drowsy. 08/24/24  Yes Ival Domino, FNP  Turmeric Curcumin CAPS Take by mouth daily.   Yes [provider]    Family  History Family History  Problem Relation Age of Onset   Lung cancer Father    Colon cancer Neg Hx    Rectal cancer Neg Hx    Stomach cancer Neg Hx    Esophageal cancer Neg Hx     Social History Social History[1]   Allergies   Patient has no known allergies.   Review of Systems Review of Systems  Constitutional:  Positive for fever. Negative for chills.  HENT:  Positive for congestion, postnasal drip and rhinorrhea. Negative for ear pain and sore throat.   Eyes:  Negative for pain and visual disturbance.  Respiratory:  Positive for cough.   Cardiovascular:  Negative for chest pain and palpitations.  Gastrointestinal:  Negative for abdominal pain, constipation, diarrhea, nausea and vomiting.  Genitourinary:  Negative for dysuria and hematuria.  Musculoskeletal:  Negative for arthralgias and back pain.  Skin:  Negative for color change and rash.  Neurological:  Negative for seizures and syncope.  All other systems reviewed and are negative.    Physical Exam Triage Vital Signs ED Triage Vitals  Encounter Vitals Group     BP 08/24/24 0907 (!) 143/90     Girls Systolic BP Percentile --      Girls Diastolic BP Percentile --      Boys Systolic BP Percentile --      Boys Diastolic BP Percentile --      Pulse Rate 08/24/24 0907 (!) 112  Resp 08/24/24 0907 18     Temp 08/24/24 0907 98.8 F (37.1 C)     Temp Source 08/24/24 0907 Oral     SpO2 08/24/24 0907 94 %     Weight --      Height --      Head Circumference --      Peak Flow --      Pain Score 08/24/24 0905 0     Pain Loc --      Pain Education --      Exclude from Growth Chart --    No data found.  Updated Vital Signs BP (!) 143/90 (BP Location: Right Arm)   Pulse (!) 112   Temp 98.8 F (37.1 C) (Oral)   Resp 18   SpO2 94%   Visual Acuity Right Eye Distance:   Left Eye Distance:   Bilateral Distance:    Right Eye Near:   Left Eye Near:    Bilateral Near:     Physical Exam Vitals and nursing  note reviewed.  Constitutional:      General: He is not in acute distress.    Appearance: He is well-developed. He is ill-appearing. He is not toxic-appearing or diaphoretic.  HENT:     Head: Normocephalic and atraumatic.     Right Ear: Hearing, tympanic membrane, ear canal and external ear normal.     Left Ear: Hearing, tympanic membrane, ear canal and external ear normal.     Nose: No congestion or rhinorrhea.     Right Sinus: No maxillary sinus tenderness or frontal sinus tenderness.     Left Sinus: No maxillary sinus tenderness or frontal sinus tenderness.     Mouth/Throat:     Lips: Pink.     Mouth: Mucous membranes are moist.     Pharynx: Uvula midline. No oropharyngeal exudate or posterior oropharyngeal erythema.     Tonsils: No tonsillar exudate.  Eyes:     Conjunctiva/sclera: Conjunctivae normal.     Pupils: Pupils are equal, round, and reactive to light.  Cardiovascular:     Rate and Rhythm: Normal rate and regular rhythm.     Heart sounds: S1 normal and S2 normal. No murmur heard. Pulmonary:     Effort: Pulmonary effort is normal. No respiratory distress.     Breath sounds: Normal breath sounds. No decreased breath sounds, wheezing, rhonchi or rales.  Abdominal:     General: Bowel sounds are normal.     Palpations: Abdomen is soft.     Tenderness: There is no abdominal tenderness.  Musculoskeletal:        General: No swelling.     Cervical back: Neck supple.  Lymphadenopathy:     Head:     Right side of head: No submental, submandibular, tonsillar, preauricular or posterior auricular adenopathy.     Left side of head: No submental, submandibular, tonsillar, preauricular or posterior auricular adenopathy.     Cervical: No cervical adenopathy.     Right cervical: No superficial cervical adenopathy.    Left cervical: No superficial cervical adenopathy.  Skin:    General: Skin is warm and dry.     Capillary Refill: Capillary refill takes less than 2 seconds.      Findings: No rash.  Neurological:     Mental Status: He is alert and oriented to person, place, and time.  Psychiatric:        Mood and Affect: Mood normal.      UC Treatments / Results  Labs (  all labs ordered are listed, but only abnormal results are displayed) Labs Reviewed  POCT INFLUENZA A/B - Abnormal; Notable for the following components:      Result Value   Influenza A, POC Positive (*)    All other components within normal limits    EKG   Radiology No results found.  Procedures Procedures (including critical care time)  Medications Ordered in UC Medications - No data to display  Initial Impression / Assessment and Plan / UC Course  I have reviewed the triage vital signs and the nursing notes.  Pertinent labs & imaging results that were available during my care of the patient were reviewed by me and considered in my medical decision making (see chart for details).  Plan of Care (see discharge instructions for additional patient precautions and education): Influenza type a with cough and fever: Tamiflu  75 mg twice daily for 5 days.  Promethazine  DM, 5 mL, every 6 hours if needed for cough.  Get plenty of fluids and rest.  Work excuse provided.  Follow-up if symptoms do not improve, worsen or new symptoms occur.  I reviewed the plan of care with the patient and/or the patient's guardian.  The patient and/or guardian had time to ask questions and acknowledged that the questions were answered.  Final Clinical Impressions(s) / UC Diagnoses   Final diagnoses:  Fever, unspecified  Acute cough  Type A influenza     Discharge Instructions      Influenza type a with cough and fever: Tamiflu  75 mg twice daily for 5 days.  Promethazine  DM, 5 mL, every 6 hours if needed for cough.  Get plenty of fluids and rest.  Work excuse provided.  Follow-up if symptoms do not improve, worsen or new symptoms occur.     ED Prescriptions     Medication Sig Dispense Auth.  Provider   oseltamivir  (TAMIFLU ) 75 MG capsule Take 1 capsule (75 mg total) by mouth every 12 (twelve) hours. 10 capsule Ival Domino, FNP   promethazine -dextromethorphan (PROMETHAZINE -DM) 6.25-15 MG/5ML syrup Take 5 mLs by mouth 4 (four) times daily as needed for cough. Do not use and drive - May make drowsy. 118 mL Ival Domino, FNP      PDMP not reviewed this encounter.    [1]  Social History Tobacco Use   Smoking status: Never   Smokeless tobacco: Never  Vaping Use   Vaping status: Never Used  Substance Use Topics   Alcohol use: Not Currently   Drug use: Never     Ival Domino, FNP 08/24/24 [phone]  "

## 2024-08-24 NOTE — ED Triage Notes (Signed)
 Pt c/o coughing, fever, congestion started 3 days ago.
